# Patient Record
Sex: Male | Born: 1935
Health system: Southern US, Community
[De-identification: ages and names within clinical notes are randomized; demographics above are authoritative.]

## PROBLEM LIST (undated history)

## (undated) DIAGNOSIS — F0390 Unspecified dementia without behavioral disturbance: Secondary | ICD-10-CM

## (undated) DIAGNOSIS — I10 Essential (primary) hypertension: Secondary | ICD-10-CM

## (undated) DIAGNOSIS — W19XXXA Unspecified fall, initial encounter: Secondary | ICD-10-CM

## (undated) DIAGNOSIS — I252 Old myocardial infarction: Secondary | ICD-10-CM

## (undated) DIAGNOSIS — E785 Hyperlipidemia, unspecified: Secondary | ICD-10-CM

## (undated) HISTORY — PX: OTHER SURGICAL HISTORY: SHX169

## (undated) HISTORY — PX: ROTATOR CUFF REPAIR: SHX139

## (undated) HISTORY — PX: CORONARY ANGIOPLASTY WITH STENT PLACEMENT: SHX49

---
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
## 2004-10-11 ENCOUNTER — Emergency Department: Payer: Self-pay | Admitting: Emergency Medicine

## 2004-10-18 ENCOUNTER — Ambulatory Visit: Payer: Self-pay | Admitting: Physician Assistant

## 2004-10-26 ENCOUNTER — Other Ambulatory Visit: Payer: Self-pay

## 2004-11-02 ENCOUNTER — Inpatient Hospital Stay: Payer: Self-pay | Admitting: Unknown Physician Specialty

## 2006-04-09 ENCOUNTER — Inpatient Hospital Stay: Payer: Self-pay | Admitting: Internal Medicine

## 2006-04-09 ENCOUNTER — Other Ambulatory Visit: Payer: Self-pay

## 2006-04-25 ENCOUNTER — Ambulatory Visit: Payer: Self-pay | Admitting: Unknown Physician Specialty

## 2007-02-15 ENCOUNTER — Inpatient Hospital Stay: Payer: Self-pay | Admitting: Internal Medicine

## 2007-02-15 ENCOUNTER — Other Ambulatory Visit: Payer: Self-pay

## 2009-02-08 ENCOUNTER — Ambulatory Visit: Payer: Self-pay | Admitting: Internal Medicine

## 2009-02-13 ENCOUNTER — Ambulatory Visit: Payer: Self-pay | Admitting: Internal Medicine

## 2009-11-22 ENCOUNTER — Emergency Department: Payer: Self-pay | Admitting: Emergency Medicine

## 2012-04-25 DIAGNOSIS — I10 Essential (primary) hypertension: Secondary | ICD-10-CM

## 2012-05-25 ENCOUNTER — Ambulatory Visit: Payer: Self-pay | Admitting: Ophthalmology

## 2012-06-01 ENCOUNTER — Ambulatory Visit: Payer: Self-pay | Admitting: Ophthalmology

## 2012-07-21 ENCOUNTER — Ambulatory Visit: Payer: Self-pay | Admitting: Ophthalmology

## 2013-01-07 ENCOUNTER — Observation Stay: Payer: Self-pay | Admitting: Family Medicine

## 2013-01-07 LAB — PROTIME-INR: INR: 1.1

## 2013-01-07 LAB — HEMOGLOBIN: HGB: 14.2 g/dL (ref 13.0–18.0)

## 2013-01-08 LAB — COMPREHENSIVE METABOLIC PANEL
Bilirubin,Total: 0.8 mg/dL (ref 0.2–1.0)
Calcium, Total: 8.2 mg/dL — ABNORMAL LOW (ref 8.5–10.1)
EGFR (Non-African Amer.): >60
Glucose: 115 mg/dL — ABNORMAL HIGH (ref 65–99)
Osmolality: 281 (ref 275–301)
Potassium: 3.4 mmol/L — ABNORMAL LOW (ref 3.5–5.1)
SGOT(AST): 18 U/L (ref 15–37)
Sodium: 141 mmol/L (ref 136–145)
Total Protein: 6.5 g/dL (ref 6.4–8.2)

## 2013-01-08 LAB — CBC WITH DIFFERENTIAL/PLATELET
Basophil #: 0 10*3/uL (ref 0.0–0.1)
Basophil %: 0.8 %
Eosinophil #: 0.3 10*3/uL (ref 0.0–0.7)
Eosinophil %: 4.2 %
HCT: 39.6 % — ABNORMAL LOW (ref 40.0–52.0)
Lymphocyte #: 1.3 10*3/uL (ref 1.0–3.6)
Lymphocyte %: 19.5 %
MCH: 31.5 pg (ref 26.0–34.0)
MCHC: 33.7 g/dL (ref 32.0–36.0)
Monocyte #: 1 x10 3/mm (ref 0.2–1.0)
Monocyte %: 15 %
Neutrophil #: 3.9 10*3/uL (ref 1.4–6.5)
Neutrophil %: 60.5 %
Platelet: 143 10*3/uL — ABNORMAL LOW (ref 150–440)
RDW: 14 % (ref 11.5–14.5)

## 2013-01-09 LAB — CBC WITH DIFFERENTIAL/PLATELET
Basophil #: 0.1 10*3/uL (ref 0.0–0.1)
Eosinophil %: 5.6 %
HCT: 40 % (ref 40.0–52.0)
HGB: 13.8 g/dL (ref 13.0–18.0)
Lymphocyte %: 23.3 %
MCH: 32.6 pg (ref 26.0–34.0)
MCHC: 34.6 g/dL (ref 32.0–36.0)
MCV: 94 fL (ref 80–100)
Monocyte %: 16.5 %
Neutrophil #: 3.1 10*3/uL (ref 1.4–6.5)
Platelet: 133 10*3/uL — ABNORMAL LOW (ref 150–440)
RBC: 4.24 10*6/uL — ABNORMAL LOW (ref 4.40–5.90)
RDW: 14.5 % (ref 11.5–14.5)
WBC: 5.8 10*3/uL (ref 3.8–10.6)

## 2013-01-09 LAB — BASIC METABOLIC PANEL
Anion Gap: 3 — ABNORMAL LOW (ref 7–16)
Chloride: 110 mmol/L — ABNORMAL HIGH (ref 98–107)
Co2: 29 mmol/L (ref 21–32)
EGFR (African American): >60
EGFR (Non-African Amer.): >60
Glucose: 93 mg/dL (ref 65–99)
Osmolality: 281 (ref 275–301)
Sodium: 142 mmol/L (ref 136–145)

## 2015-02-13 DIAGNOSIS — Z0000 Encounter for general adult medical examination without abnormal findings: Secondary | ICD-10-CM | POA: Diagnosis not present

## 2015-02-14 DIAGNOSIS — I2510 Atherosclerotic heart disease of native coronary artery without angina pectoris: Secondary | ICD-10-CM | POA: Diagnosis not present

## 2015-02-14 DIAGNOSIS — I10 Essential (primary) hypertension: Secondary | ICD-10-CM | POA: Diagnosis not present

## 2015-02-14 DIAGNOSIS — I5022 Chronic systolic (congestive) heart failure: Secondary | ICD-10-CM | POA: Diagnosis not present

## 2015-02-14 DIAGNOSIS — E782 Mixed hyperlipidemia: Secondary | ICD-10-CM | POA: Diagnosis not present

## 2015-02-15 DIAGNOSIS — R7309 Other abnormal glucose: Secondary | ICD-10-CM | POA: Diagnosis not present

## 2015-02-15 DIAGNOSIS — I10 Essential (primary) hypertension: Secondary | ICD-10-CM | POA: Diagnosis not present

## 2015-02-15 DIAGNOSIS — Z0000 Encounter for general adult medical examination without abnormal findings: Secondary | ICD-10-CM | POA: Diagnosis not present

## 2015-02-15 DIAGNOSIS — E782 Mixed hyperlipidemia: Secondary | ICD-10-CM | POA: Diagnosis not present

## 2015-02-21 NOTE — Op Note (Signed)
PATIENT NAME:  [name], [name] MR#:  [id number] DATE OF BIRTH:  1926-11-28  DATE OF PROCEDURE:  07/21/2012  PREOPERATIVE DIAGNOSIS: Visually significant cataract of the right eye.   POSTOPERATIVE DIAGNOSIS: Visually significant cataract of the right eye.   OPERATIVE PROCEDURE: Cataract extraction by phacoemulsification with implant of intraocular lens to the right eye.   SURGEON: Birder Robson, MD.   ANESTHESIA:  1. Managed anesthesia care.  2. Topical tetracaine drops followed by 2% Xylocaine jelly applied in the preoperative holding area.   COMPLICATIONS: None.   TECHNIQUE:   Stop and chop.  DESCRIPTION OF PROCEDURE: The patient was examined and consented in the preoperative holding area where the aforementioned topical anesthesia was applied to the right eye and then brought back to the Operating Room where the right eye was prepped and draped in the usual sterile ophthalmic fashion and a lid speculum was placed. A paracentesis was created with the side port blade and the anterior chamber was filled with viscoelastic. A near clear corneal incision was performed with the steel keratome. A continuous curvilinear capsulorrhexis was performed with a cystotome followed by the capsulorrhexis forceps. Hydrodissection and hydrodelineation were carried out with BSS on a blunt cannula. The lens was removed in a stop and chop technique and the remaining cortical material was removed with the irrigation-aspiration handpiece. The capsular bag was inflated with viscoelastic and the Tecnis ZCB00 23-diopter lens, serial number [id number] was placed in the capsular bag without complication. The remaining viscoelastic was removed from the eye with the irrigation-aspiration handpiece. The wounds were hydrated. The anterior chamber was flushed with Miostat and the eye was inflated to physiologic pressure. The wounds were found to be water tight. The eye was dressed with Vigamox. The patient was given  protective glasses to wear throughout the day and a shield with which to sleep tonight. The patient was also given drops with which to begin a drop regimen today and will follow up with me in one day.     ____________________________ Livingston Diones. Graclynn Vanantwerp, MD wlp:bjt D: 07/21/2012 12:35:02 ET T: 07/21/2012 13:47:20 ET JOB#: [id number]  cc: Agustina Witzke L. Rani Idler, MD, <Dictator> Livingston Diones Jesiel Garate MD ELECTRONICALLY SIGNED 08/24/2012 13:45

## 2015-02-21 NOTE — Op Note (Signed)
PATIENT NAME:  [name], [name] MR#:  [id number] DATE OF BIRTH:  1927/10/13  DATE OF PROCEDURE:  06/01/2012  PREOPERATIVE DIAGNOSIS: Visually significant cataract of the left eye.   POSTOPERATIVE DIAGNOSIS: Visually significant cataract of the left eye.   OPERATIVE PROCEDURE: Cataract extraction by phacoemulsification with implant of intraocular lens to left eye.   SURGEON: Birder Robson, MD.   ANESTHESIA:  1. Managed anesthesia care.  2. Topical tetracaine drops followed by 2% Xylocaine jelly applied in the preoperative holding area.   COMPLICATIONS: None.   TECHNIQUE:  Stop and chop.   DESCRIPTION OF PROCEDURE: The patient was examined and consented in the preoperative holding area where the aforementioned topical anesthesia was applied to the left eye and then brought back to the Operating Room where the left eye was prepped and draped in the usual sterile ophthalmic fashion and a lid speculum was placed. A paracentesis was created with the side port blade and the anterior chamber was filled with viscoelastic. A near clear corneal incision was performed with the steel keratome. A continuous curvilinear capsulorrhexis was performed with a cystotome followed by the capsulorrhexis forceps. Hydrodissection and hydrodelineation were carried out with BSS on a blunt cannula. The lens was removed in a stop and chop technique and the remaining cortical material was removed with the irrigation-aspiration handpiece. The capsular bag was inflated with viscoelastic and the Tecnis ZCB00 24.0-diopter lens, serial number [id number] was placed in the capsular bag without complication. The remaining viscoelastic was removed from the eye with the irrigation-aspiration handpiece. The wounds were hydrated. The anterior chamber was flushed with Miostat and the eye was inflated to physiologic pressure. The wounds were found to be water tight. The eye was dressed with Vigamox. The patient was given protective  glasses to wear throughout the day and a shield with which to sleep tonight. The patient was also given drops with which to begin a drop regimen today and will follow-up with me in one day.  ____________________________ Livingston Diones. Sharin Altidor, MD wlp:slb D: 06/01/2012 20:10:00 ET T: 06/02/2012 08:36:53 ET JOB#: [id number]  cc: Ogechi Kuehnel L. Anagha Loseke, MD, <Dictator> Livingston Diones Aarian Cleaver MD ELECTRONICALLY SIGNED 06/09/2012 12:49

## 2015-02-24 NOTE — Op Note (Signed)
PATIENT NAME:  [name], [name] MR#:  [id number] DATE OF BIRTH:  10/31/1927  DATE OF PROCEDURE:  01/09/2013  PREOPERATIVE DIAGNOSIS:  Hemorrhoids.   POSTOPERATIVE DIAGNOSIS:  Hemorrhoids.   PROCEDURE:  Hemorrhoidectomy.   SURGEON:  Rochel Brome, M.D.   ANESTHESIA:  General.   INDICATIONS:  This 81 year old male came in with bright red rectal bleeding and colonoscopic findings of large internal hemorrhoids, physical findings of external hemorrhoids. Hemorrhoidectomy was recommended for definitive treatment to resolve his bleeding.  The patient was placed on the operating table in the supine position under general anesthesia. The legs were elevated into the lithotomy position using ankle straps. The anal area was prepared with ChloraPrep and draped in a sterile manner. The anoderm was infiltrated with 0.5% Sensorcaine with epinephrine. The anal canal was dilated large enough to admit 4 fingers. The bivalve anal retractor was introduced and further gently dilated the anal canal. Multiple large internal and external hemorrhoids were demonstrated. The largest internal hemorrhoids were found at the 4 o'clock position and next largest at 11 o'clock and next largest at 8 o'clock position.  Three internal and external hemorrhoidectomy columns were removed. Beginning at 11 o'clock position, a high ligation of the internal component was done with a 2-0 chromic suture ligature. A V-shaped incision was made externally with a scalpel. Next, electrocautery was used to dissect the external hemorrhoid away from the surrounding subcutaneous tissues. Dissection was carried out by exposing the internal anal sphincter and dissection was carried up to the previously placed suture ligature where the hemorrhoid was further ligated with the same ligature excised. The wound was inspected. Several small bleeding points were cauterized. The wound was closed with a running locked tied 2-0 chromic with a small opening left  externally for drainage.   Next, a similar procedure was carried out at the 4 o'clock position with a similar dissection ligation and closure.   Next, the same procedure is carried out at the 8 o'clock position.  Following this, the wound was further prepared with Betadine and skin and subcutaneous tissues were infiltrated with 20 mL of Exparel.  Next, dressings were applied with paper tape. The patient tolerated surgery satisfactorily, did have some bigeminy with induction of anesthesia which soon resolved and was transferred to the recovery room in satisfactory condition.    ____________________________ Lenna Sciara. Rochel Brome, MD jws:ce D: 01/09/2013 13:53:42 ET T: 01/09/2013 18:21:04 ET JOB#: [id number]  cc: Loreli Dollar, MD, <Dictator> Loreli Dollar MD ELECTRONICALLY SIGNED 01/12/2013 19:05

## 2015-02-24 NOTE — Consult Note (Signed)
Pt with rectal bleeding of BRB, last colonoscopy over 6 years ago.  No chest pain, no SOB, plan colonoscopy tomorrow after Golytely prep tonight.  Electronic Signatures: Manya Silvas (MD)  (Signed on 06-Mar-14 18:02)  Authored  Last Updated: 06-Mar-14 18:02 by Manya Silvas (MD)

## 2015-02-24 NOTE — H&P (Signed)
PATIENT NAME:  [name], [name] MR#:  [id number] DATE OF BIRTH:  1927/07/13  DATE OF ADMISSION:  01/07/2013  PRIMARY CARE PHYSICIAN:  Dr. Serita Kyle, [facility].   CHIEF COMPLAINT:  Rectal bleeding.   HISTORY OF PRESENT ILLNESS:  This is an 81 year old gentleman who complains of 3 days of bright red blood per rectum, with a significant amount filling the toilet. He reports no associated dizziness, vision changes, syncopal episodes, chest pain, dyspnea, or worsening weakness. Denies any abdominal pain, nausea, vomiting, or diarrhea associated. He has a known history of adenomas, with last colonoscopy in 04/25/2006 per Dr. Vira Agar, consistent with diverticulosis. He also has a known history of hemorrhoids, and also a known history of coronary artery disease status post stent placement, and congestive heart failure. He was seen in the office today for the 3 days of rectal bleeding on exam. He has significant external hemorrhoids, with no active bleeding, but digital consistent with blood per rectum. I spoke with Dawson Bills, who did recommend inpatient treatment and evaluation. Therefore, the patient was offered opportunity to be admitted [facility], and patient agreed.   PAST MEDICAL HISTORY: 1.  History of coronary artery disease status post stent placement in December 1990, followed by Dr. Nehemiah Massed.  2.  History of diverticulosis and history of colonic adenomas.  3.  History of peptic ulcer disease.  4.  History of GI bleed, 1988.  5.  History of systolic congestive heart failure with an EF of 40%, done with echo on May 2013, followed by Dr. Nehemiah Massed.  6.  History of hyperlipidemia with LDL of 83 in September 2013.  7.  History of osteoarthritis.   SURGICAL HISTORY: 1.  Status post stent placement, 1990 per [facility].  2.  Rotator cuff repair in 2005.  3.  History of bilateral cataract surgery.   REVIEW OF SYSTEMS: As stated above.   FAMILY HISTORY:  Father deceased at age 9 from a  complete heart block. Mother deceased at age 79 from heart disease. Has 8 sisters, 29 of whom are deceased, none of whom have heart disease or cancer.   SOCIAL HISTORY:  He is married and lives with his wife. He is retired. No tobacco, alcohol or drug use. No supplements. Exercise is nonexistent.   PHYSICAL EXAM:  Height 5 feet 7, weight 179, blood pressure 146/92, pulse 66.  GENERAL  EXAM:  This is a well-appearing white male in no apparent distress.  HEENT:  Extraocular movements intact. Pupils are equal and reactive to light and accommodation. Moist mucous membranes.  NECK:  Supple, without lymphadenopathy.  CARDIOVASCULAR:  Regular rate and rhythm. No murmurs, rubs, or gallops. Normal S1, S2.  RESPIRATORY:  Clear to auscultation bilaterally.  ABDOMEN:  Soft, nontender, nondistended. No rebound or guarding.  EXTREMITIES:  No edema.  SKIN:  Normal color, normal turgor.  RECTAL EXAM:  Moderate external hemorrhoids, with blood on digital exam.  NEUROLOGIC:  Alert and oriented x 3. Cranial nerves II-XII grossly intact with no focal deficits.   PERTINENT LABS:  Hemoglobin of 13.9, platelets of 161 and white blood cell count is 7.   ASSESSMENT AND PLAN: 1.  Rectal bleeding:  Acute. The patient has a 3-day history of rectal bleeding. Unsure of etiology at this point. He does have a history of colonic adenomas, and also has a history of diverticulosis and a history of hemorrhoids. My concern is that this could be more internal, therefore after speaking with Dawson Bills with GI, recommended hospitalization  with further evaluation, possible colonoscopy per GI. GI has been consulted; at this time holding his aspirin therapy. Will place him on n.p.o. at this time and cover him with D5 half-normal saline while he is n.p.o. except for medications. He is hemodynamically stable and does not need a transfusion at this time.  2.  History of coronary artery disease, and history of congestive heart failure:   Appears to be stable. No changes to his home regimen except for holding the aspirin.  3.  Other chronic medical issues remain stable at this time.   DISPOSITION:  HE IS A DNR/DNI.   Will be admitted to telemetry on observation status, awaiting further GI evaluation and recommendations, I anticipate a short hospital stay pending a possible procedure.    ____________________________ Dion Body, MD kl:dm D: 01/07/2013 12:29:23 ET T: 01/07/2013 13:16:22 ET JOB#: [id number]  cc: Dion Body, MD, <Dictator> Dion Body MD ELECTRONICALLY SIGNED 01/29/2013 7:59

## 2015-02-24 NOTE — Consult Note (Signed)
Pt had colonoscopy showing diverticulosis and prominent internal/external hemorrhoids.  this was the bleeding site.  Dr, Rochel Brome will see patient today. Pt desires removal.  Electronic Signatures: Manya Silvas (MD)  (Signed on 07-Mar-14 15:13)  Authored  Last Updated: 07-Mar-14 15:13 by Manya Silvas (MD)

## 2015-02-24 NOTE — Consult Note (Signed)
Chief Complaint:  Subjective/Chief Complaint Covering for Dr. Vira Agar. Some rectal bleeding last night. Large hemorrhoids.   VITAL SIGNS/ANCILLARY NOTES: **Vital Signs.:   08-Mar-14 04:59  Vital Signs Type Routine  Temperature Temperature (F) 97.6  Celsius 36.4  Temperature Source oral  Pulse Pulse 64  Respirations Respirations 18  Systolic BP Systolic BP 519  Diastolic BP (mmHg) Diastolic BP (mmHg) 61  Mean BP 79  Pulse Ox % Pulse Ox % 91  Pulse Ox Activity Level  At rest  Oxygen Delivery Room Air/ 21 %   Brief Assessment:  Cardiac Regular   Respiratory clear BS   Gastrointestinal Normal   Lab Results: Routine Chem:  08-Mar-14 05:44   Glucose, Serum 93  BUN 7  Creatinine (comp) 0.97  Sodium, Serum 142  Potassium, Serum 4.3  Chloride, Serum  110  CO2, Serum 29  Calcium (Total), Serum  8.3  Anion Gap  3  Osmolality (calc) 281  eGFR (African American) >60  eGFR (Non-African American) >60 (eGFR values <62m/min/1.73 m2 may be an indication of chronic kidney disease (CKD). Calculated eGFR is useful in patients with stable renal function. The eGFR calculation will not be reliable in acutely ill patients when serum creatinine is changing rapidly. It is not useful in  patients on dialysis. The eGFR calculation may not be applicable to patients at the low and high extremes of body sizes, pregnant women, and vegetarians.)  Routine Hem:  08-Mar-14 05:44   WBC (CBC) 5.8  RBC (CBC)  4.24  Hemoglobin (CBC) 13.8  Hematocrit (CBC) 40.0  Platelet Count (CBC)  133  MCV 94  MCH 32.6  MCHC 34.6  RDW 14.5  Neutrophil % 53.5  Lymphocyte % 23.3  Monocyte % 16.5  Eosinophil % 5.6  Basophil % 1.1  Neutrophil # 3.1  Lymphocyte # 1.4  Monocyte # 1.0  Eosinophil # 0.3  Basophil # 0.1 (Result(s) reported on 09 Jan 2013 at 06:16AM.)   Assessment/Plan:  Assessment/Plan:  Assessment Hemorrhoidal bleeding.   Plan Fro hemorrhoidectomy at 11AM today. Will check back in AM.  Thanks.   Electronic Signatures: OVerdie Shire(MD)  (Signed 08-Mar-14 09:49)  Authored: Chief Complaint, VITAL SIGNS/ANCILLARY NOTES, Brief Assessment, Lab Results, Assessment/Plan   Last Updated: 08-Mar-14 09:49 by OVerdie Shire(MD)

## 2015-02-24 NOTE — Discharge Summary (Signed)
PATIENT NAME:  [name] [name] MR#:  [id number] DATE OF BIRTH:  18-Jan-1927  DATE OF ADMISSION:  01/07/2013  DATE OF DISCHARGE: 01/11/2013    DISCHARGE DIAGNOSES: 1.  Rectal bleeding secondary to bleeding hemorrhoid.  2.  History of coronary artery disease.  3.  History of systolic congestive heart failure with ejection fraction of 40% on echo, seen on 03/2012.  4.  History of osteoarthritis.  5.  History of hyperlipidemia.   DISCHARGE MEDICATIONS: 1.  Prilosec 20 mg p.o. daily.  2.  Amoxicillin 500 mg p.o. t.i.d.  3.  Spironolactone 25 mg p.o. daily.  4.  Lisinopril 10 mg p.o. daily.  5.  Carvedilol 3.125 mg p.o. b.i.d.  6.  Simvastatin 40 mg p.o. at bedtime.  7.  Percocet 5/325, 1 tab every 4 hours as needed for pain.  8.  Docusate 100 mg p.o. b.i.d.   CONSULTS:  GI per Dr. Vira Agar, and also surgery per Dr. Tamala Julian.   PROCEDURES: Had a colonoscopy performed that did convey diverticulosis and large internal and external hemorrhoids. The patient underwent a hemorrhoidectomy on 01/09/2013 per Dr. Tamala Julian.   PERTINENT LABORATORY DATA:  On March 8th, he had a hemoglobin of 13.8.   BRIEF HOSPITAL COURSE:  1.  Acute rectal bleeding. The patient initially was admitted because of acute rectal bleeding. Colonoscopy was performed, which did show hemorrhoid bleeding. Surgery was consulted per Dr. Tamala Julian. Hemorrhoidectomy was performed. The patient tolerated the procedure. It has some significant pain. He is being treated with Percocet at this time. He will be discharged home. Advised to advance diet and ambulation as tolerated. He will follow up with Dr. Netty Starring within 10 days for further evaluation.  2.  Other chronic medical issues remain stable. Continue home regimen with the exception of aspirin. We will plan to hold the aspirin for now. Will likely restart that upon re-evaluation.   DISPOSITION:  He is in stable condition, being discharged to home. Follow up with Dr. Netty Starring within 10  days.      ____________________________ Dion Body, MD kl:dmm D: 01/11/2013 08:01:05 ET T: 01/11/2013 10:41:02 ET JOB#: [id number]  cc: Dion Body, MD, <Dictator> [facility] Dion Body MD ELECTRONICALLY SIGNED 01/29/2013 7:59

## 2015-02-24 NOTE — Consult Note (Signed)
PATIENT NAME:  [name], [name] MR#:  [id number] DATE OF BIRTH:  1927-07-11  DATE OF CONSULTATION:  01/08/2013  REFERRING PHYSICIAN:   CONSULTING PHYSICIAN:  Loreli Dollar, MD   HISTORY OF PRESENT ILLNESS: This 81 year old male has a chief complaint of rectal bleeding. He reports greater than 50 year history of hemorrhoids and occasionally has seen some blood and occasionally has some swelling and bulging and moderate discomfort. However, in the last few days, he has been passing bright red blood with his bowel movements, turning commode water red and was admitted emergently. He reports no associated severe pain. No abdominal pain. No nausea or vomiting. No chills or fever. No dizziness or syncope. He was admitted and begun on IV fluids and had gastroenterology consultation with Dr. Vira Agar and  today had colonoscopy. Did have findings of diverticulosis, but had findings of large internal and also external hemorrhoids and it was demonstrated that the cause of bleeding was the large internal hemorrhoids.   PAST MEDICAL HISTORY: Does include a history of coronary artery disease, did have a myocardial infarction in 1990 and was treated with stent, also has some past history of systolic congestive failure, had echocardiogram May 2013 with ejection fraction 40%. He reports he has been doing very well with his heart in recent months. He is quite active physically. He has had no chest pain.   Other history does include hyperlipidemia, past history of peptic ulcer disease and gastrointestinal bleeding. History of osteoarthritis.   PAST SURGICAL HISTORY: Includes he above-mentioned this insertion of stent 1990.  History of rotator cuff surgery 2005, history of cataract surgery.   MEDICATIONS: Include amoxicillin 500 mg 3 times a day, aspirin 81 mg daily, carvedilol 3.125 mg 2 times per day, lisinopril 10 mg daily, Prilosec 20 mg daily, Simvastatin 40 mg daily, spironolactone 25 mg daily.   ALLERGIES:  VICODIN.     FAMILY HISTORY: Positive for heart disease.   SOCIAL HISTORY: He been married for 43 years,  accompanied by his wife and his son, does not smoke, does not drink any alcohol.   REVIEW OF SYSTEMS: He reports no other recent acute illness such as cough, cold or sore throat. No difficulties breathing with exertion. No chest pains. He has maintained a steady weight. Has good appetite. No nausea or vomiting. Occasionally has some mild constipation and occasionally takes a stool softener. He has no urinary symptoms. No recent sores or boils. No ankle edema. Review of systems otherwise negative.   PHYSICAL EXAMINATION: GENERAL: He is awake, alert and oriented. He is able to walk easily from the bathroom to the bed.  VITAL SIGNS: Temperature 98.3, pulse 69, respirations 18, blood pressure 132/67, pulse oximetry 95%.  SKIN: Warm and dry without rash.   HEENT: Pupils equal, reactive to light, extraocular movements are intact. Palpebral conjunctivae normal red color. Pharynx is clear.  NECK: No palpable mass.  LUNGS: Lungs sounds are clear. No respiratory distress.  HEART: Regular rhythm, S1 and S2 without murmur.  ABDOMEN: Soft, flat, nontender with no palpable mass. No hepatomegaly.  RECTAL: Exam demonstrates multiple external hemorrhoids, which appear to be minimally swollen. Digital exam demonstrates marked amount of soft, mobile tissue consistent with internal hemorrhoids. No hard palpable mass.  EXTREMITIES: No dependent edema.  NEUROLOGIC: Awake, alert, oriented, moving all extremities.   CLINICAL DATA: His glucose slightly high at 115, potassium slightly low at 3.4, albumin 3.3, white blood count 6800. Hemoglobin 13.3. Platelet count slightly low at 143,000. Pro time 14.1  seconds.   IMPRESSION: Internal and external hemorrhoids with bleeding. He is currently in stable condition. I did recommend internal and external hemorrhoidectomy and with the presentation of significant amounts  of bright red blood, recommended that we proceed with the surgery tomorrow morning while he is still in the hospital and I have discussed the operation, care, risks, and benefits with him in detail and will get it scheduled and probably keep him overnight after surgery for a period of observation.      ____________________________ Lenna Sciara. Rochel Brome, MD jws:cc D: 01/08/2013 17:45:13 ET T: 01/08/2013 19:51:56 ET JOB#: [id number]  cc: Loreli Dollar, MD, <Dictator> Loreli Dollar MD ELECTRONICALLY SIGNED 01/09/2013 14:48

## 2015-02-24 NOTE — Consult Note (Signed)
Brief Consult Note: Diagnosis: Rectal bleeding.   Patient was seen by consultant.   Consult note dictated.   Comments: Appreciate consult for 81 y/o caucasian man with history diverticulosis, adenomatous polyps, PUD, CHF, LVEF 40%, and cardiac stenting (4098), for evaluation of rectal bleeding. Patient reports onset of bright red blood with normal-type bowel movements since Tuesday evening at a frequency of once daily. States the blood turns the water red and he cannot see through it. Says sometimes he strains with bowel movements, but not often. Denies abdominal pain, constipation, NVD, black/tarry stools, further GI complaints. States his last episode of bleeding was about 0700 this am.  Had one episode of dizziness after bending over, but none further. Denies syncope and further complaints. Was in his usual state of health prior to this. Last colonsocopy 2007. Has had EGD in past- last was 2007 with gastritis. Additionally- hemoglobin today approx 10am 13.9, wbc 7, Platelets 161. Hemoglobin 9/13 was 13.5- found in chart review. Metc 1/21 with normal kidney function and electrolytes  Impression and plan: Rectal bleeding- may be anal outlet v. other. Discussed with Dr Tiffany Kocher, will plan for colonsocopy tommorrow if stable cardiopulmonary wise.  Electronic Signatures: Stephens November H (NP)  (Signed 06-Mar-14 16:30)  Authored: Brief Consult Note   Last Updated: 06-Mar-14 16:30 by Theodore Demark (NP)

## 2015-02-24 NOTE — Consult Note (Signed)
PATIENT NAME:  [name], [name] MR#:  [id number] DATE OF BIRTH:  [birth year]  DATE OF CONSULTATION:  01/07/2013  REFERRING PHYSICIAN:   CONSULTING PHYSICIAN:  Theodore Demark, NP  GI has been consulted by Dr. Netty Starring for the evaluation of rectal bleeding. We appreciate this consult for a very pleasant 81 year old Caucasian man with history of diverticulitis, adenomatous polyps, peptic ulcer disease, CHF, LVEF 40% and cardiac stenting in 1990 for evaluation of rectal bleeding. The patient reports onset of bright red blood with normal-type bowel movements since Tuesday evening at a frequency of once daily. States the blood turns the water red and he cannot see through it. He says sometimes he strains with bowel movements, but not often. Denies abdominal pain, constipation, nausea, vomiting, diarrhea, black tarry stools, and no further GI complaints. He states his last episode of bleeding was about 0700 this morning. Had one episode of dizziness after bending over, but none further. Denies syncope and further complaints. Was in his usual state of health prior to this. Last colonoscopy 2007. Has had EGD in the past, the last was 2007 with gastritis. Additionally, hemoglobin today at approximately 10:00 a.m. was 13.9. WC 7, platelets 161, hemoglobin done September 2013 was 13.5, chemistry panel 11/24/2012 demonstrated normal kidney function and normal electrolytes. This was found in [facility] chart review.  PAST MEDICAL HISTORY: CAD status post stent placement 1990, diverticulosis PUD. GI bleed 1988, colonic adenoma, systolic CHF with an EF of 40%, hyperlipidemia, osteoarthritis, PTCA 1990, rotator cuff repair 2005, bilateral cataract surgeries.  MEDICATIONS:  Aspirin 81 mg p.o. daily, did not take this today. Lisinopril 10 mg p.o. daily, omeprazole 20 mg p.o. daily, simvastatin 40 mg p.o. q.p.m., carvedilol 3.125 one tab b.i.d., spironolactone 25 mg p.o. daily, amoxicillin 500 mg 1 tab p.o. t.i.d. for recent  sinus infection.   ALLERGIES:  LINCOCIN.   FAMILY HISTORY: Significant for heart block, heart disease. No family history of colorectal cancer, colon polyps, liver disease or ulcers.   SOCIAL HISTORY: Married, lives with wife. No alcohol, tobacco, or illicits. Active with exercise.  REVIEW OF SYSTEMS:  10 systems reviewed. Unremarkable other than what is noted above.   MOST VITAL SIGNS:  Temperature 98.1, pulse 61, respiratory rate 18, blood pressure 143/78, SaO2 95%.   PHYSICAL EXAMINATION: GENERAL: Pleasant well-appearing Caucasian man lying in bed in no acute distress.  HEENT: Normocephalic and atraumatic. Conjunctivae are pink. Sclerae are clear. No redness, drainage or inflammation to the eyes, nares or mouth.  NECK:  Supple. No JVD, thyromegaly, lymphadenopathy.  RESPIRATORY:  Respirations eupneic. Lungs clear.  CARDIAC: S1, S2 RRR. No MRG. Peripheral pulses 2+. No appreciable edema.  ABDOMEN: Nondistended. Bowel sounds x 4. Soft, nontender. No hepatosplenomegaly, masses, rebound, tenderness, guarding, peritoneal signs, rigidity or other abnormalities. He is nontender.  RECTAL: Several hemorrhoids at the anal ring. Stool bright red in small amount, nontender. No active bleeding at this time.  SKIN:  Warm, dry, pink. No erythema, lesion or rash.  EXTREMITIES:  MAEW x 4. Strength 5/5. Sensation intact. No clubbing or cyanosis.  NEUROLOGIC: Alert, oriented x 3. Cranial nerves II through XII grossly intact. Speech clear. No facial droop.  PSYCHIATRIC:  Calm, cooperative, pleasant, logical train of thought.   IMPRESSION AND PLAN:  Rectal bleeding may be anal outlet versus other. We will order a PT-INR, schedule a hemoglobin for later this afternoon. Discussed with Dr. Vira Agar. We will plan for colonoscopy tomorrow if he is stable colorrhaphy or pulmonary-wise and we will assess  an EKG later on this evening.  These services were provided by Stephens November, MSN, [facility] in collaboration with  Dr. Gaylyn Cheers with whom I have discussed this patient in full.  Thank you for this consult.    ____________________________ Theodore Demark, NP chl:ce D: 01/07/2013 16:37:41 ET T: 01/07/2013 16:51:16 ET JOB#: [id number]  cc: Theodore Demark, NP, <Dictator> [place] SIGNED 02/16/2013 8:25

## 2015-02-24 NOTE — Consult Note (Signed)
Chief Complaint:  Subjective/Chief Complaint S/P hemorrhoidectomy yest. Sore anus. Otherwise, no complaints. No further bleeding.   VITAL SIGNS/ANCILLARY NOTES: **Vital Signs.:   09-Mar-14 07:45  Vital Signs Type Routine  Temperature Temperature (F) 97.6  Celsius 36.4  Temperature Source oral  Pulse Pulse 72  Respirations Respirations 18  Systolic BP Systolic BP 892  Diastolic BP (mmHg) Diastolic BP (mmHg) 68  Mean BP 86  Pulse Ox % Pulse Ox % 93  Pulse Ox Activity Level  At rest  Oxygen Delivery 2L   Brief Assessment:  Cardiac Regular   Respiratory clear BS   Gastrointestinal Normal   Lab Results: Routine Chem:  08-Mar-14 05:44   Glucose, Serum 93  BUN 7  Creatinine (comp) 0.97  Sodium, Serum 142  Potassium, Serum 4.3  Chloride, Serum  110  CO2, Serum 29  Calcium (Total), Serum  8.3  Anion Gap  3  Osmolality (calc) 281  eGFR (African American) >60  eGFR (Non-African American) >60 (eGFR values <63mL/min/1.73 m2 may be an indication of chronic kidney disease (CKD). Calculated eGFR is useful in patients with stable renal function. The eGFR calculation will not be reliable in acutely ill patients when serum creatinine is changing rapidly. It is not useful in  patients on dialysis. The eGFR calculation may not be applicable to patients at the low and high extremes of body sizes, pregnant women, and vegetarians.)  Routine Hem:  08-Mar-14 05:44   WBC (CBC) 5.8  RBC (CBC)  4.24  Hemoglobin (CBC) 13.8  Hematocrit (CBC) 40.0  Platelet Count (CBC)  133  MCV 94  MCH 32.6  MCHC 34.6  RDW 14.5  Neutrophil % 53.5  Lymphocyte % 23.3  Monocyte % 16.5  Eosinophil % 5.6  Basophil % 1.1  Neutrophil # 3.1  Lymphocyte # 1.4  Monocyte # 1.0  Eosinophil # 0.3  Basophil # 0.1 (Result(s) reported on 09 Jan 2013 at 06:16AM.)   Assessment/Plan:  Assessment/Plan:  Assessment LGI bleeding from hemorrhoids. S/P hemorrhoidectomy. No further bleeding.   Plan Will sign  off. Follow Dr. Thompson Caul recommendations. Thanks.   Electronic Signatures: Verdie Shire (MD)  (Signed 09-Mar-14 08:46)  Authored: Chief Complaint, VITAL SIGNS/ANCILLARY NOTES, Brief Assessment, Lab Results, Assessment/Plan   Last Updated: 09-Mar-14 08:46 by Verdie Shire (MD)

## 2015-03-15 DIAGNOSIS — Z23 Encounter for immunization: Secondary | ICD-10-CM | POA: Diagnosis not present

## 2015-03-31 DIAGNOSIS — M5432 Sciatica, left side: Secondary | ICD-10-CM | POA: Diagnosis not present

## 2015-07-28 DIAGNOSIS — J069 Acute upper respiratory infection, unspecified: Secondary | ICD-10-CM | POA: Diagnosis not present

## 2015-07-28 DIAGNOSIS — E780 Pure hypercholesterolemia: Secondary | ICD-10-CM | POA: Diagnosis not present

## 2015-08-10 DIAGNOSIS — J209 Acute bronchitis, unspecified: Secondary | ICD-10-CM | POA: Diagnosis not present

## 2015-08-10 DIAGNOSIS — R05 Cough: Secondary | ICD-10-CM | POA: Diagnosis not present

## 2015-08-17 DIAGNOSIS — I340 Nonrheumatic mitral (valve) insufficiency: Secondary | ICD-10-CM | POA: Diagnosis not present

## 2015-08-17 DIAGNOSIS — E7800 Pure hypercholesterolemia, unspecified: Secondary | ICD-10-CM | POA: Diagnosis not present

## 2015-08-17 DIAGNOSIS — Z23 Encounter for immunization: Secondary | ICD-10-CM | POA: Diagnosis not present

## 2015-08-17 DIAGNOSIS — I25118 Atherosclerotic heart disease of native coronary artery with other forms of angina pectoris: Secondary | ICD-10-CM | POA: Diagnosis not present

## 2015-08-17 DIAGNOSIS — I10 Essential (primary) hypertension: Secondary | ICD-10-CM | POA: Diagnosis not present

## 2015-08-18 DIAGNOSIS — I25118 Atherosclerotic heart disease of native coronary artery with other forms of angina pectoris: Secondary | ICD-10-CM | POA: Diagnosis not present

## 2015-08-21 DIAGNOSIS — I25118 Atherosclerotic heart disease of native coronary artery with other forms of angina pectoris: Secondary | ICD-10-CM | POA: Diagnosis not present

## 2015-08-21 DIAGNOSIS — I5022 Chronic systolic (congestive) heart failure: Secondary | ICD-10-CM | POA: Diagnosis not present

## 2015-08-21 DIAGNOSIS — I10 Essential (primary) hypertension: Secondary | ICD-10-CM | POA: Diagnosis not present

## 2015-08-22 DIAGNOSIS — L821 Other seborrheic keratosis: Secondary | ICD-10-CM | POA: Diagnosis not present

## 2015-08-22 DIAGNOSIS — D485 Neoplasm of uncertain behavior of skin: Secondary | ICD-10-CM | POA: Diagnosis not present

## 2015-08-22 DIAGNOSIS — L820 Inflamed seborrheic keratosis: Secondary | ICD-10-CM | POA: Diagnosis not present

## 2015-10-18 DIAGNOSIS — H353132 Nonexudative age-related macular degeneration, bilateral, intermediate dry stage: Secondary | ICD-10-CM | POA: Diagnosis not present

## 2015-12-05 DIAGNOSIS — E7800 Pure hypercholesterolemia, unspecified: Secondary | ICD-10-CM | POA: Diagnosis not present

## 2015-12-12 DIAGNOSIS — E7800 Pure hypercholesterolemia, unspecified: Secondary | ICD-10-CM | POA: Diagnosis not present

## 2015-12-12 DIAGNOSIS — I5022 Chronic systolic (congestive) heart failure: Secondary | ICD-10-CM | POA: Diagnosis not present

## 2015-12-12 DIAGNOSIS — I10 Essential (primary) hypertension: Secondary | ICD-10-CM | POA: Diagnosis not present

## 2015-12-23 ENCOUNTER — Emergency Department: Payer: Commercial Managed Care - HMO

## 2015-12-23 ENCOUNTER — Emergency Department
Admission: EM | Admit: 2015-12-23 | Discharge: 2015-12-23 | Disposition: A | Discharge status: Home / Self Care | Payer: Commercial Managed Care - HMO | Attending: Emergency Medicine | Admitting: Emergency Medicine

## 2015-12-23 ENCOUNTER — Encounter: Payer: Self-pay | Admitting: Emergency Medicine

## 2015-12-23 DIAGNOSIS — W19XXXA Unspecified fall, initial encounter: Secondary | ICD-10-CM

## 2015-12-23 DIAGNOSIS — Y92009 Unspecified place in unspecified non-institutional (private) residence as the place of occurrence of the external cause: Secondary | ICD-10-CM | POA: Diagnosis not present

## 2015-12-23 DIAGNOSIS — S0083XA Contusion of other part of head, initial encounter: Secondary | ICD-10-CM | POA: Diagnosis not present

## 2015-12-23 DIAGNOSIS — S0990XA Unspecified injury of head, initial encounter: Secondary | ICD-10-CM | POA: Insufficient documentation

## 2015-12-23 DIAGNOSIS — Y998 Other external cause status: Secondary | ICD-10-CM | POA: Insufficient documentation

## 2015-12-23 DIAGNOSIS — S0591XA Unspecified injury of right eye and orbit, initial encounter: Secondary | ICD-10-CM | POA: Diagnosis present

## 2015-12-23 DIAGNOSIS — W01198A Fall on same level from slipping, tripping and stumbling with subsequent striking against other object, initial encounter: Secondary | ICD-10-CM | POA: Insufficient documentation

## 2015-12-23 DIAGNOSIS — S0511XA Contusion of eyeball and orbital tissues, right eye, initial encounter: Secondary | ICD-10-CM | POA: Diagnosis not present

## 2015-12-23 DIAGNOSIS — Y9389 Activity, other specified: Secondary | ICD-10-CM | POA: Diagnosis not present

## 2015-12-23 HISTORY — DX: Old myocardial infarction: I25.2

## 2015-12-23 NOTE — Discharge Instructions (Signed)
Facial or Scalp Contusion  A facial or scalp contusion is a deep bruise on the face or head. Contusions happen when an injury causes bleeding under the skin. Signs of bruising include pain, puffiness (swelling), and discolored skin. The contusion may turn blue, purple, or yellow. HOME CARE  Only take medicines as told by your doctor.  Put ice on the injured area.  Put ice in a plastic bag.  Place a towel between your skin and the bag.  Leave the ice on for 20 minutes, 2-3 times a day. GET HELP IF:  You have bite problems.  You have pain when chewing.  You are worried about your face not healing normally. GET HELP RIGHT AWAY IF:   You have severe pain or a headache and medicine does not help.  You are very tired or confused, or your personality changes.  You throw up (vomit).  You have a nosebleed that will not stop.  You see two of everything (double vision) or have blurry vision.  You have fluid coming from your nose or ear.  You have problems walking or using your arms or legs. MAKE SURE YOU:   Understand these instructions.  Will watch your condition.  Will get help right away if you are not doing well or get worse.   This information is not intended to replace advice given to you by your health care provider. Make sure you discuss any questions you have with your health care provider.   Document Released: 10/10/2011 Document Revised: 11/11/2014 Document Reviewed: 06/03/2013 Elsevier Interactive Patient Education 2016 [employer]  Hematoma A hematoma is a collection of blood. The collection of blood can turn into a hard, painful lump under the skin. Your skin may turn blue or yellow if the hematoma is close to the surface of the skin. Most hematomas get better in a few days to weeks. Some hematomas are serious and need medical care. Hematomas can be very small or very big. HOME CARE  Apply ice to the injured area:  Put ice in a plastic bag.  Place a towel  between your skin and the bag.  Leave the ice on for 20 minutes, 2-3 times a day for the first 1 to 2 days.  After the first 2 days, switch to using warm packs on the injured area.  Raise (elevate) the injured area to lessen pain and puffiness (swelling). You may also wrap the area with an elastic bandage. Make sure the bandage is not wrapped too tight.  If you have a painful hematoma on your leg or foot, you may use crutches for a couple days.  Only take medicines as told by your doctor. GET HELP RIGHT AWAY IF:   Your pain gets worse.  Your pain is not controlled with medicine.  You have a fever.  Your puffiness gets worse.  Your skin turns more blue or yellow.  Your skin over the hematoma breaks or starts bleeding.  Your hematoma is in your chest or belly (abdomen) and you are short of breath, feel weak, or have a change in consciousness.  Your hematoma is on your scalp and you have a headache that gets worse or a change in alertness or consciousness. MAKE SURE YOU:   Understand these instructions.  Will watch your condition.  Will get help right away if you are not doing well or get worse.   This information is not intended to replace advice given to you by your health care provider. Make  sure you discuss any questions you have with your health care provider.   Document Released: 11/28/2004 Document Revised: 06/23/2013 Document Reviewed: 03/31/2013 Elsevier Interactive Patient Education 2016 [place] Injury, Adult You have a head injury. Headaches and throwing up (vomiting) are common after a head injury. It should be easy to wake up from sleeping. Sometimes you must stay in the hospital. Most problems happen within the first 24 hours. Side effects may occur up to 7-10 days after the injury.  WHAT ARE THE TYPES OF HEAD INJURIES? Head injuries can be as minor as a bump. Some head injuries can be more severe. More severe head injuries include:  A jarring  injury to the brain (concussion).  A bruise of the brain (contusion). This mean there is bleeding in the brain that can cause swelling.  A cracked skull (skull fracture).  Bleeding in the brain that collects, clots, and forms a bump (hematoma). WHEN SHOULD I GET HELP RIGHT AWAY?   You are confused or sleepy.  You cannot be woken up.  You feel sick to your stomach (nauseous) or keep throwing up (vomiting).  Your dizziness or unsteadiness is getting worse.  You have very bad, lasting headaches that are not helped by medicine. Take medicines only as told by your doctor.  You cannot use your arms or legs like normal.  You cannot walk.  You notice changes in the black spots in the center of the colored part of your eye (pupil).  You have clear or bloody fluid coming from your nose or ears.  You have trouble seeing. During the next 24 hours after the injury, you must stay with someone who can watch you. This person should get help right away (call 911 in the [place] if you start to shake and are not able to control it (have seizures), you pass out, or you are unable to wake up. HOW CAN I PREVENT A HEAD INJURY IN THE FUTURE?  Wear seat belts.  Wear a helmet while bike riding and playing sports like football.  Stay away from dangerous activities around the house. WHEN CAN I RETURN TO NORMAL ACTIVITIES AND ATHLETICS? See your doctor before doing these activities. You should not do normal activities or play contact sports until 1 week after the following symptoms have stopped:  Headache that does not go away.  Dizziness.  Poor attention.  Confusion.  Memory problems.  Sickness to your stomach or throwing up.  Tiredness.  Fussiness.  Bothered by bright lights or loud noises.  Anxiousness or depression.  Restless sleep. MAKE SURE YOU:   Understand these instructions.  Will watch your condition.  Will get help right away if you are not doing well or get worse.     This information is not intended to replace advice given to you by your health care provider. Make sure you discuss any questions you have with your health care provider.   Document Released: 10/03/2008 Document Revised: 11/11/2014 Document Reviewed: 06/28/2013 Elsevier Interactive Patient Education [employer].  Your exam and head CT are normal today following your fall at home today. Apply ice to the face to reduce pain and swelling. Apply neosporin to the face to promote healing. Follow-up with your provider for ongoing symptoms.

## 2015-12-23 NOTE — ED Notes (Signed)
States fell this am when tripped on step. Hit forehead and has large hematoma and abrasion above R eye. Describes mechanical fall. Denies headache, only sore at hematoma. No LOC. Takes baby ASA per day. Alert oriented man with no deficits noted in triage.

## 2015-12-23 NOTE — ED Provider Notes (Signed)
[facility] Emergency Department Provider Note ____________________________________________  Time seen: 1542  I have reviewed the triage vital signs and the nursing notes.  HISTORY  Chief Complaint  Abrasion  HPI [name] is a 82 y.o. male presents to the ED accompanied by his adult son for evaluation of injuries sustained following a fall at home this morning. The patient describes stepping up from the sunken floorof the storage shed, when his foot got caught on the step. This caused him to lurch forward hitting his head and face on the concrete in front of the car. He denies loss of consciousness, nausea, vomiting, or other injury. He presents today with swelling over the left brow and abrasion to the face in the same region. He describes overall discomfort to his face as a 6/10 in triage.  Past Medical History  Diagnosis Date  . MI, old     There are no active problems to display for this patient.   Past Surgical History  Procedure Laterality Date  . Hemmorroid    . Rotator cuff repair    . Coronary angioplasty with stent placement      No current outpatient prescriptions on file.  Allergies Review of patient's allergies indicates no known allergies.  No family history on file.  Social History Social History  Substance Use Topics  . Smoking status: Never Smoker   . Smokeless tobacco: None  . Alcohol Use: No   Review of Systems  Constitutional: Negative for fever. Eyes: Negative for visual changes. ENT: Negative for sore throat. Cardiovascular: Negative for chest pain. Respiratory: Negative for shortness of breath. Gastrointestinal: Negative for abdominal pain, vomiting and diarrhea. Genitourinary: Negative for dysuria. Musculoskeletal: Negative for back pain. Skin: Negative for rash. Reports of abrasion and hematoma as above. Neurological: Negative for headaches, focal weakness or  numbness. ____________________________________________  PHYSICAL EXAM:  VITAL SIGNS: ED Triage Vitals  Enc Vitals Group     BP 12/23/15 1346 129/65 mmHg     Pulse Rate 12/23/15 1346 71     Resp 12/23/15 1346 18     Temp 12/23/15 1346 98 F (36.7 C)     Temp Source 12/23/15 1346 Oral     SpO2 12/23/15 1346 97 %     Weight 12/23/15 1346 180 lb (81.647 kg)     Height 12/23/15 1346 5\' 11"  (1.803 m)     Head Cir --      Peak Flow --      Pain Score 12/23/15 1348 6     Pain Loc --      Pain Edu? --      Excl. in [place]? --    Constitutional: Alert and oriented. Well appearing and in no distress. Head: Normocephalic and atraumatic except for a large hematoma overlying the right brow. Superficial abrasion is noted locally.      Eyes: Conjunctivae are normal. PERRL. Normal extraocular movements. Normal funduscopic exam bilaterally.      Ears: Canals clear. TMs intact bilaterally.   Nose: No congestion/rhinorrhea.   Mouth/Throat: Mucous membranes are moist.   Neck: Supple. No thyromegaly. Hematological/Lymphatic/Immunological: No cervical lymphadenopathy. Cardiovascular: Normal rate, regular rhythm.  Respiratory: Normal respiratory effort. No wheezes/rales/rhonchi. Gastrointestinal: Soft and nontender. No distention. Musculoskeletal: Nontender with normal range of motion in all extremities.  Neurologic:  Cranial nerves II through XII grossly intact. Normal gait without ataxia. Normal speech and language. No gross focal neurologic deficits are appreciated. Skin:  Skin is warm, dry and intact. No rash  noted. Psychiatric: Mood and affect are normal. Patient exhibits appropriate insight and judgment. ____________________________________________   RADIOLOGY  Head CT  IMPRESSION: No acute intracranial pathology. ____________________________________________  INITIAL IMPRESSION / ASSESSMENT AND PLAN / ED COURSE  Patient with a with a mechanical fall resulting in a facial  contusion and hematoma. No CT evidence of intracranial pathology. Patient is discharged with instructions on managing hematoma and facial abrasions. He is encouraged to dose Tylenol as needed for pain relief. He will apply cool compresses to the face and follow with his primary care provider for ongoing symptom management. Return precautions are reviewed. ____________________________________________  FINAL CLINICAL IMPRESSION(S) / ED DIAGNOSES  Final diagnoses:  Fall at home, initial encounter  Facial contusion, initial encounter  Facial hematoma, initial encounter     Melvenia Needles, PA-C 12/23/15 1618  Nance Pear, MD 12/23/15 [phone]

## 2015-12-23 NOTE — ED Notes (Signed)
NAD noted at time of D/C. Pt denies questions or concerns. Pt ambulatory to the lobby at this time with his family. Pt refused wheelchair to the lobby.

## 2015-12-28 DIAGNOSIS — S0511XA Contusion of eyeball and orbital tissues, right eye, initial encounter: Secondary | ICD-10-CM | POA: Diagnosis not present

## 2016-02-19 DIAGNOSIS — R2681 Unsteadiness on feet: Secondary | ICD-10-CM | POA: Diagnosis not present

## 2016-02-19 DIAGNOSIS — E7800 Pure hypercholesterolemia, unspecified: Secondary | ICD-10-CM | POA: Diagnosis not present

## 2016-02-19 DIAGNOSIS — I2510 Atherosclerotic heart disease of native coronary artery without angina pectoris: Secondary | ICD-10-CM | POA: Diagnosis not present

## 2016-02-19 DIAGNOSIS — I5022 Chronic systolic (congestive) heart failure: Secondary | ICD-10-CM | POA: Diagnosis not present

## 2016-04-08 DIAGNOSIS — I340 Nonrheumatic mitral (valve) insufficiency: Secondary | ICD-10-CM | POA: Diagnosis not present

## 2016-04-08 DIAGNOSIS — I5022 Chronic systolic (congestive) heart failure: Secondary | ICD-10-CM | POA: Diagnosis not present

## 2016-04-08 DIAGNOSIS — I2510 Atherosclerotic heart disease of native coronary artery without angina pectoris: Secondary | ICD-10-CM | POA: Diagnosis not present

## 2016-04-08 DIAGNOSIS — I071 Rheumatic tricuspid insufficiency: Secondary | ICD-10-CM | POA: Diagnosis not present

## 2016-04-08 DIAGNOSIS — I10 Essential (primary) hypertension: Secondary | ICD-10-CM | POA: Diagnosis not present

## 2016-04-11 DIAGNOSIS — I10 Essential (primary) hypertension: Secondary | ICD-10-CM | POA: Diagnosis not present

## 2016-04-11 DIAGNOSIS — I5022 Chronic systolic (congestive) heart failure: Secondary | ICD-10-CM | POA: Diagnosis not present

## 2016-04-11 DIAGNOSIS — E7800 Pure hypercholesterolemia, unspecified: Secondary | ICD-10-CM | POA: Diagnosis not present

## 2016-04-15 ENCOUNTER — Emergency Department: Payer: Commercial Managed Care - HMO

## 2016-04-15 ENCOUNTER — Emergency Department
Admission: EM | Admit: 2016-04-15 | Discharge: 2016-04-15 | Disposition: A | Discharge status: Home / Self Care | Payer: Commercial Managed Care - HMO | Attending: Emergency Medicine | Admitting: Emergency Medicine

## 2016-04-15 DIAGNOSIS — M25531 Pain in right wrist: Secondary | ICD-10-CM | POA: Diagnosis not present

## 2016-04-15 DIAGNOSIS — Z955 Presence of coronary angioplasty implant and graft: Secondary | ICD-10-CM | POA: Insufficient documentation

## 2016-04-15 DIAGNOSIS — S299XXA Unspecified injury of thorax, initial encounter: Secondary | ICD-10-CM | POA: Diagnosis not present

## 2016-04-15 DIAGNOSIS — M25521 Pain in right elbow: Secondary | ICD-10-CM | POA: Diagnosis not present

## 2016-04-15 DIAGNOSIS — S51811A Laceration without foreign body of right forearm, initial encounter: Secondary | ICD-10-CM | POA: Diagnosis not present

## 2016-04-15 DIAGNOSIS — I252 Old myocardial infarction: Secondary | ICD-10-CM | POA: Insufficient documentation

## 2016-04-15 DIAGNOSIS — Y9389 Activity, other specified: Secondary | ICD-10-CM | POA: Insufficient documentation

## 2016-04-15 DIAGNOSIS — Y92410 Unspecified street and highway as the place of occurrence of the external cause: Secondary | ICD-10-CM | POA: Insufficient documentation

## 2016-04-15 DIAGNOSIS — M542 Cervicalgia: Secondary | ICD-10-CM | POA: Diagnosis not present

## 2016-04-15 DIAGNOSIS — Y999 Unspecified external cause status: Secondary | ICD-10-CM | POA: Diagnosis not present

## 2016-04-15 DIAGNOSIS — S3991XA Unspecified injury of abdomen, initial encounter: Secondary | ICD-10-CM | POA: Insufficient documentation

## 2016-04-15 DIAGNOSIS — S0990XA Unspecified injury of head, initial encounter: Secondary | ICD-10-CM | POA: Diagnosis not present

## 2016-04-15 DIAGNOSIS — S199XXA Unspecified injury of neck, initial encounter: Secondary | ICD-10-CM | POA: Diagnosis not present

## 2016-04-15 DIAGNOSIS — S59901A Unspecified injury of right elbow, initial encounter: Secondary | ICD-10-CM | POA: Diagnosis not present

## 2016-04-15 LAB — COMPREHENSIVE METABOLIC PANEL
ALBUMIN: 3.7 g/dL (ref 3.5–5.0)
ALT: 10 U/L — AB (ref 17–63)
AST: 20 U/L (ref 15–41)
Alkaline Phosphatase: 49 U/L (ref 38–126)
Anion gap: 9 (ref 5–15)
BUN: 22 mg/dL — ABNORMAL HIGH (ref 6–20)
CHLORIDE: 107 mmol/L (ref 101–111)
CO2: 22 mmol/L (ref 22–32)
CREATININE: 1.23 mg/dL (ref 0.61–1.24)
Calcium: 8.3 mg/dL — ABNORMAL LOW (ref 8.9–10.3)
GFR calc non Af Amer: 51 mL/min — ABNORMAL LOW (ref >60)
GFR, EST AFRICAN AMERICAN: 59 mL/min — AB (ref >60)
GLUCOSE: 108 mg/dL — AB (ref 65–99)
Potassium: 3.8 mmol/L (ref 3.5–5.1)
SODIUM: 138 mmol/L (ref 135–145)
Total Bilirubin: 0.4 mg/dL (ref 0.3–1.2)
Total Protein: 6.1 g/dL — ABNORMAL LOW (ref 6.5–8.1)

## 2016-04-15 LAB — CBC WITH DIFFERENTIAL/PLATELET
BASOS ABS: 0 10*3/uL (ref 0–0.1)
Basophils Relative: 1 %
Eosinophils Absolute: 0.3 10*3/uL (ref 0–0.7)
Eosinophils Relative: 5 %
HCT: 37.8 % — ABNORMAL LOW (ref 40.0–52.0)
Hemoglobin: 12.8 g/dL — ABNORMAL LOW (ref 13.0–18.0)
LYMPHS ABS: 1.6 10*3/uL (ref 1.0–3.6)
LYMPHS PCT: 24 %
MCH: 31.4 pg (ref 26.0–34.0)
MCHC: 33.9 g/dL (ref 32.0–36.0)
MCV: 92.6 fL (ref 80.0–100.0)
MONOS PCT: 12 %
Monocytes Absolute: 0.8 10*3/uL (ref 0.2–1.0)
NEUTROS ABS: 4 10*3/uL (ref 1.4–6.5)
NEUTROS PCT: 58 %
PLATELETS: 148 10*3/uL — AB (ref 150–440)
RBC: 4.09 MIL/uL — AB (ref 4.40–5.90)
RDW: 14.3 % (ref 11.5–14.5)
WBC: 6.7 10*3/uL (ref 3.8–10.6)

## 2016-04-15 MED ORDER — IOPAMIDOL (ISOVUE-300) INJECTION 61%
100.0000 mL | Freq: Once | INTRAVENOUS | Status: AC | PRN
  Administered 2016-04-15: 100 mL via INTRAVENOUS

## 2016-04-15 NOTE — ED Notes (Signed)
Cleaned pt's skin tear with sterile saline, placed xeroform and wrapped with gauze
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
## 2016-04-15 NOTE — ED Notes (Signed)
Reviewed d/c instructions, follow-up care, use of ice/elevation, and OTC pain meds with pt. Pt verbalized understanding

## 2016-04-17 DIAGNOSIS — S60511D Abrasion of right hand, subsequent encounter: Secondary | ICD-10-CM | POA: Diagnosis not present

## 2016-04-18 DIAGNOSIS — I10 Essential (primary) hypertension: Secondary | ICD-10-CM | POA: Diagnosis not present

## 2016-04-18 DIAGNOSIS — E7800 Pure hypercholesterolemia, unspecified: Secondary | ICD-10-CM | POA: Diagnosis not present

## 2016-04-18 DIAGNOSIS — R7303 Prediabetes: Secondary | ICD-10-CM | POA: Diagnosis not present

## 2016-04-18 DIAGNOSIS — Z0000 Encounter for general adult medical examination without abnormal findings: Secondary | ICD-10-CM | POA: Diagnosis not present

## 2016-04-18 DIAGNOSIS — Z23 Encounter for immunization: Secondary | ICD-10-CM | POA: Diagnosis not present

## 2016-07-10 DIAGNOSIS — I340 Nonrheumatic mitral (valve) insufficiency: Secondary | ICD-10-CM | POA: Diagnosis not present

## 2016-07-10 DIAGNOSIS — I5022 Chronic systolic (congestive) heart failure: Secondary | ICD-10-CM | POA: Diagnosis not present

## 2016-07-10 DIAGNOSIS — I10 Essential (primary) hypertension: Secondary | ICD-10-CM | POA: Diagnosis not present

## 2016-07-10 DIAGNOSIS — E7800 Pure hypercholesterolemia, unspecified: Secondary | ICD-10-CM | POA: Diagnosis not present

## 2016-07-10 DIAGNOSIS — I071 Rheumatic tricuspid insufficiency: Secondary | ICD-10-CM | POA: Diagnosis not present

## 2016-07-10 DIAGNOSIS — I2510 Atherosclerotic heart disease of native coronary artery without angina pectoris: Secondary | ICD-10-CM | POA: Diagnosis not present

## 2016-07-30 DIAGNOSIS — Z23 Encounter for immunization: Secondary | ICD-10-CM | POA: Diagnosis not present

## 2016-08-21 DIAGNOSIS — S52501A Unspecified fracture of the lower end of right radius, initial encounter for closed fracture: Secondary | ICD-10-CM | POA: Diagnosis not present

## 2016-08-21 DIAGNOSIS — M25531 Pain in right wrist: Secondary | ICD-10-CM | POA: Diagnosis not present

## 2016-08-21 DIAGNOSIS — I10 Essential (primary) hypertension: Secondary | ICD-10-CM | POA: Diagnosis not present

## 2016-08-21 DIAGNOSIS — R7303 Prediabetes: Secondary | ICD-10-CM | POA: Diagnosis not present

## 2016-08-21 DIAGNOSIS — E7800 Pure hypercholesterolemia, unspecified: Secondary | ICD-10-CM | POA: Diagnosis not present

## 2016-08-21 DIAGNOSIS — S52511A Displaced fracture of right radial styloid process, initial encounter for closed fracture: Secondary | ICD-10-CM | POA: Diagnosis not present

## 2016-08-27 DIAGNOSIS — S52591A Other fractures of lower end of right radius, initial encounter for closed fracture: Secondary | ICD-10-CM | POA: Diagnosis not present

## 2016-08-27 DIAGNOSIS — M79601 Pain in right arm: Secondary | ICD-10-CM | POA: Diagnosis not present

## 2016-09-03 DIAGNOSIS — S52591D Other fractures of lower end of right radius, subsequent encounter for closed fracture with routine healing: Secondary | ICD-10-CM | POA: Diagnosis not present

## 2016-09-04 DIAGNOSIS — S62101D Fracture of unspecified carpal bone, right wrist, subsequent encounter for fracture with routine healing: Secondary | ICD-10-CM | POA: Diagnosis not present

## 2016-09-04 DIAGNOSIS — I10 Essential (primary) hypertension: Secondary | ICD-10-CM | POA: Diagnosis not present

## 2016-09-04 DIAGNOSIS — E7800 Pure hypercholesterolemia, unspecified: Secondary | ICD-10-CM | POA: Diagnosis not present

## 2016-09-04 DIAGNOSIS — R7303 Prediabetes: Secondary | ICD-10-CM | POA: Diagnosis not present

## 2016-09-25 DIAGNOSIS — S52591D Other fractures of lower end of right radius, subsequent encounter for closed fracture with routine healing: Secondary | ICD-10-CM | POA: Diagnosis not present

## 2016-10-08 DIAGNOSIS — R001 Bradycardia, unspecified: Secondary | ICD-10-CM | POA: Diagnosis not present

## 2016-10-08 DIAGNOSIS — I2510 Atherosclerotic heart disease of native coronary artery without angina pectoris: Secondary | ICD-10-CM | POA: Diagnosis not present

## 2016-10-08 DIAGNOSIS — I10 Essential (primary) hypertension: Secondary | ICD-10-CM | POA: Diagnosis not present

## 2016-10-08 DIAGNOSIS — I5022 Chronic systolic (congestive) heart failure: Secondary | ICD-10-CM | POA: Diagnosis not present

## 2016-10-23 DIAGNOSIS — S52591D Other fractures of lower end of right radius, subsequent encounter for closed fracture with routine healing: Secondary | ICD-10-CM | POA: Diagnosis not present

## 2016-12-02 DIAGNOSIS — J208 Acute bronchitis due to other specified organisms: Secondary | ICD-10-CM | POA: Diagnosis not present

## 2016-12-02 DIAGNOSIS — B9689 Other specified bacterial agents as the cause of diseases classified elsewhere: Secondary | ICD-10-CM | POA: Diagnosis not present

## 2016-12-02 DIAGNOSIS — J209 Acute bronchitis, unspecified: Secondary | ICD-10-CM | POA: Diagnosis not present

## 2016-12-18 DIAGNOSIS — S52591D Other fractures of lower end of right radius, subsequent encounter for closed fracture with routine healing: Secondary | ICD-10-CM | POA: Diagnosis not present

## 2016-12-18 DIAGNOSIS — S63591D Other specified sprain of right wrist, subsequent encounter: Secondary | ICD-10-CM | POA: Diagnosis not present

## 2016-12-26 DIAGNOSIS — E7800 Pure hypercholesterolemia, unspecified: Secondary | ICD-10-CM | POA: Diagnosis not present

## 2016-12-26 DIAGNOSIS — R7303 Prediabetes: Secondary | ICD-10-CM | POA: Diagnosis not present

## 2016-12-26 DIAGNOSIS — I10 Essential (primary) hypertension: Secondary | ICD-10-CM | POA: Diagnosis not present

## 2016-12-31 ENCOUNTER — Emergency Department: Payer: Medicare HMO

## 2016-12-31 ENCOUNTER — Encounter: Payer: Self-pay | Admitting: *Deleted

## 2016-12-31 ENCOUNTER — Emergency Department
Admission: EM | Admit: 2016-12-31 | Discharge: 2016-12-31 | Disposition: A | Discharge status: Home / Self Care | Payer: Medicare HMO | Attending: Emergency Medicine | Admitting: Emergency Medicine

## 2016-12-31 DIAGNOSIS — R609 Edema, unspecified: Secondary | ICD-10-CM | POA: Diagnosis not present

## 2016-12-31 DIAGNOSIS — Z955 Presence of coronary angioplasty implant and graft: Secondary | ICD-10-CM | POA: Insufficient documentation

## 2016-12-31 DIAGNOSIS — M6520 Calcific tendinitis, unspecified site: Secondary | ICD-10-CM | POA: Diagnosis not present

## 2016-12-31 DIAGNOSIS — Z79899 Other long term (current) drug therapy: Secondary | ICD-10-CM | POA: Diagnosis not present

## 2016-12-31 DIAGNOSIS — Z7982 Long term (current) use of aspirin: Secondary | ICD-10-CM | POA: Diagnosis not present

## 2016-12-31 DIAGNOSIS — I509 Heart failure, unspecified: Secondary | ICD-10-CM | POA: Diagnosis not present

## 2016-12-31 DIAGNOSIS — R600 Localized edema: Secondary | ICD-10-CM | POA: Diagnosis not present

## 2016-12-31 DIAGNOSIS — S161XXA Strain of muscle, fascia and tendon at neck level, initial encounter: Secondary | ICD-10-CM | POA: Diagnosis not present

## 2016-12-31 DIAGNOSIS — M542 Cervicalgia: Secondary | ICD-10-CM | POA: Diagnosis not present

## 2016-12-31 DIAGNOSIS — M6528 Calcific tendinitis, other site: Secondary | ICD-10-CM | POA: Diagnosis not present

## 2016-12-31 LAB — COMPREHENSIVE METABOLIC PANEL
ALT: 11 U/L — AB (ref 17–63)
AST: 21 U/L (ref 15–41)
Albumin: 4 g/dL (ref 3.5–5.0)
Alkaline Phosphatase: 72 U/L (ref 38–126)
Anion gap: 8 (ref 5–15)
BILIRUBIN TOTAL: 0.6 mg/dL (ref 0.3–1.2)
BUN: 12 mg/dL (ref 6–20)
CHLORIDE: 105 mmol/L (ref 101–111)
CO2: 26 mmol/L (ref 22–32)
CREATININE: 0.89 mg/dL (ref 0.61–1.24)
Calcium: 8.1 mg/dL — ABNORMAL LOW (ref 8.9–10.3)
GFR calc non Af Amer: >60 mL/min (ref >60)
Glucose, Bld: 109 mg/dL — ABNORMAL HIGH (ref 65–99)
Potassium: 3.2 mmol/L — ABNORMAL LOW (ref 3.5–5.1)
Sodium: 139 mmol/L (ref 135–145)
Total Protein: 6.8 g/dL (ref 6.5–8.1)

## 2016-12-31 LAB — CBC
HCT: 37 % — ABNORMAL LOW (ref 40.0–52.0)
Hemoglobin: 13 g/dL (ref 13.0–18.0)
MCH: 32.4 pg (ref 26.0–34.0)
MCHC: 35.3 g/dL (ref 32.0–36.0)
MCV: 91.8 fL (ref 80.0–100.0)
PLATELETS: 149 10*3/uL — AB (ref 150–440)
RBC: 4.03 MIL/uL — AB (ref 4.40–5.90)
RDW: 14.6 % — ABNORMAL HIGH (ref 11.5–14.5)
WBC: 9.4 10*3/uL (ref 3.8–10.6)

## 2016-12-31 LAB — TROPONIN I

## 2016-12-31 LAB — BRAIN NATRIURETIC PEPTIDE: B Natriuretic Peptide: 481 pg/mL — ABNORMAL HIGH (ref 0.0–100.0)

## 2016-12-31 LAB — LACTIC ACID, PLASMA: LACTIC ACID, VENOUS: 1.1 mmol/L (ref 0.5–1.9)

## 2016-12-31 LAB — INFLUENZA PANEL BY PCR (TYPE A & B)
Influenza A By PCR: NEGATIVE
Influenza B By PCR: NEGATIVE

## 2016-12-31 MED ORDER — LIDOCAINE 5 % EX PTCH
1.0000 | MEDICATED_PATCH | CUTANEOUS | Status: DC
  Administered 2016-12-31: 1 via TRANSDERMAL
  Filled 2016-12-31: qty 1

## 2016-12-31 MED ORDER — LIDOCAINE 5 % EX PTCH: 1.0000 | MEDICATED_PATCH | Freq: Two times a day (BID) | CUTANEOUS | 0 refills | Status: AC

## 2016-12-31 MED ORDER — KETOROLAC TROMETHAMINE 30 MG/ML IJ SOLN
30.0000 mg | Freq: Once | INTRAMUSCULAR | Status: AC
  Administered 2016-12-31: 30 mg via INTRAVENOUS
  Filled 2016-12-31: qty 1

## 2016-12-31 MED ORDER — ETODOLAC 200 MG PO CAPS: 200.0000 mg | ORAL_CAPSULE | Freq: Three times a day (TID) | ORAL | 0 refills | Status: DC

## 2016-12-31 NOTE — ED Provider Notes (Signed)
[facility] Emergency Department Provider Note   ____________________________________________   First MD Initiated Contact with Patient 12/31/16 0037     (approximate)  I have reviewed the triage vital signs and the nursing notes.   HISTORY  Chief Complaint Neck Pain and Headache    HPI [name] is a 83 y.o. male who comes into the hospital today with pain in the back of his neck. He reports that he couldn't maneuver his neck especially to the right. This started yesterday and lingered on. He reports that he was unsure if was coming from his ears or what. He went to bed and tried to relax but he couldn't. The back of his neck Hurting. He thought maybe it was heart related. He took some Tylenol extra strength but it didn't help. He refused to take any more tonight. They also noticed that he had some foot swelling since yesterday so they decided to come in. He denies any history of swelling to his feet and denies having a headache. He reports he is not having neck pain in the past but he has had multiple falls recently. He denies chest pain has some mild shortness of breath no nausea no vomiting no dizziness or lightheadedness. He is here for evaluation.   Past Medical History:  Diagnosis Date  . MI, old     There are no active problems to display for this patient.   Past Surgical History:  Procedure Laterality Date  . CORONARY ANGIOPLASTY WITH STENT PLACEMENT    . hemmorroid    . ROTATOR CUFF REPAIR      Prior to Admission medications   Medication Sig Start Date End Date Taking? Authorizing Provider  acetaminophen (TYLENOL) 500 MG tablet Take 500 mg by mouth every 6 (six) hours as needed.   Yes Historical Provider, MD  aspirin 81 MG tablet Take 81 mg by mouth daily.   Yes Historical Provider, MD  atorvastatin (LIPITOR) 20 MG tablet Take 20 mg by mouth daily.   Yes Historical Provider, MD  carvedilol (COREG) 3.125 MG tablet Take 3.125 mg by  mouth 2 (two) times daily with a meal.   Yes Historical Provider, MD  lisinopril (PRINIVIL,ZESTRIL) 10 MG tablet Take 10 mg by mouth daily.   Yes Historical Provider, MD  omeprazole (PRILOSEC) 20 MG capsule Take 20 mg by mouth daily.   Yes Historical Provider, MD  spironolactone (ALDACTONE) 25 MG tablet Take 25 mg by mouth daily.   Yes Historical Provider, MD  etodolac (LODINE) 200 MG capsule Take 1 capsule (200 mg total) by mouth every 8 (eight) hours. 12/31/16   Loney Hering, MD  lidocaine (LIDODERM) 5 % Place 1 patch onto the skin every 12 (twelve) hours. Remove & Discard patch within 12 hours or as directed by MD 12/31/16 12/31/17  Loney Hering, MD    Allergies Patient has no known allergies.  No family history on file.  Social History Social History  Substance Use Topics  . Smoking status: Never Smoker  . Smokeless tobacco: Never Used  . Alcohol use No    Review of Systems Constitutional: No fever/chills Eyes: No visual changes. ENT: No sore throat. Cardiovascular: Denies chest pain. Respiratory: Denies shortness of breath. Gastrointestinal: No abdominal pain.  No nausea, no vomiting.  No diarrhea.  No constipation. Genitourinary: Negative for dysuria. Musculoskeletal: neck pain Skin: Negative for rash. Neurological: Negative for headaches, focal weakness or numbness. Hematological/Lymphatic:bilateral foot swelling  10-point ROS otherwise negative.  ____________________________________________   PHYSICAL EXAM:  VITAL SIGNS: ED Triage Vitals  Enc Vitals Group     BP 12/31/16 0020 (!) 156/76     Pulse Rate 12/31/16 0020 72     Resp 12/31/16 0020 20     Temp 12/31/16 0024 98.4 F (36.9 C)     Temp Source 12/31/16 0020 Oral     SpO2 12/31/16 0020 96 %     Weight 12/31/16 0020 174 lb (78.9 kg)     Height 12/31/16 0020 5\' 10"  (1.778 m)     Head Circumference --      Peak Flow --      Pain Score 12/31/16 0021 6     Pain Loc --      Pain Edu? --       Excl. in [place]? --     Constitutional: Alert and oriented. Well appearing and in moderate distress. Eyes: Conjunctivae are normal. PERRL. EOMI. Head: Atraumatic. Nose: No congestion/rhinnorhea. Mouth/Throat: Mucous membranes are moist.  Oropharynx non-erythematous. Neck:  cervical spine tenderness to palpation. Cardiovascular: Normal rate, regular rhythm. Grossly normal heart sounds.  Good peripheral circulation. Respiratory: Normal respiratory effort.  No retractions. Lungs CTAB. Gastrointestinal: Soft and nontender. No distention. Positive bowel sounds Musculoskeletal: No lower extremity tenderness, mild lower extremity edema   Neurologic:  Normal speech and language.  Skin:  Skin is warm, dry and intact.  Psychiatric: Mood and affect are normal.  ____________________________________________   LABS (all labs ordered are listed, but only abnormal results are displayed)  Labs Reviewed  CBC - Abnormal; Notable for the following:       Result Value   RBC 4.03 (*)    HCT 37.0 (*)    RDW 14.6 (*)    Platelets 149 (*)    All other components within normal limits  COMPREHENSIVE METABOLIC PANEL - Abnormal; Notable for the following:    Potassium 3.2 (*)    Glucose, Bld 109 (*)    Calcium 8.1 (*)    ALT 11 (*)    All other components within normal limits  BRAIN NATRIURETIC PEPTIDE - Abnormal; Notable for the following:    B Natriuretic Peptide 481.0 (*)    All other components within normal limits  TROPONIN I  LACTIC ACID, PLASMA  INFLUENZA PANEL BY PCR (TYPE A & B)  LACTIC ACID, PLASMA   ____________________________________________  EKG  none ____________________________________________  RADIOLOGY  CT cervical spine ____________________________________________   PROCEDURES  Procedure(s) performed: None  Procedures  Critical Care performed: No  ____________________________________________   INITIAL IMPRESSION / ASSESSMENT AND PLAN / ED COURSE  Pertinent labs  & imaging results that were available during my care of the patient were reviewed by me and considered in my medical decision making (see chart for details).  This is an 83 year old male who comes into the hospital today with some neck pain. It is been bothering him over the last day or 2. Tylenol did not help for this pain at home. He was concerned it may be related to his heart because he also had some swelling in his legs. The patient did have a CT scan that showed he had some calcific tendinitis of the longus coli as well as some mild to severe canal stenosis C4-C7. I did give the patient a shot of Toradol as well as a Lidoderm patch to his neck. I will give the patient some pain medicine for home as well as some Lidoderm patches. I will have him follow-up with  orthopedic surgery. He does have an appointment with his doctor on Thursday which I think would be an appropriate place to discuss his leg swelling. He does have a history of congestive heart failure and is on spironolactone. He may need to increase his dose of spironolactone. The patient otherwise has no other complaints and he'll be discharged home.  Clinical Course as of Dec 31 316  Tue Dec 31, 2016  0220 CT findings of acute calcific longus coli tendinitis.  Stable grade 1 C3-4 anterolisthesis without spondylolysis.  Mild canal stenosis C4-5 and C6-7. Severe C4-5 through C6-7 neural foraminal narrowing.   CT Cervical Spine Wo Contrast [AW]    Clinical Course User Index [AW] Loney Hering, MD     ____________________________________________   FINAL CLINICAL IMPRESSION(S) / ED DIAGNOSES  Final diagnoses:  Neck pain  Calcific tendinitis  Edema, unspecified type      NEW MEDICATIONS STARTED DURING THIS VISIT:  New Prescriptions   ETODOLAC (LODINE) 200 MG CAPSULE    Take 1 capsule (200 mg total) by mouth every 8 (eight) hours.   LIDOCAINE (LIDODERM) 5 %    Place 1 patch onto the skin every 12 (twelve) hours.  Remove & Discard patch within 12 hours or as directed by MD     Note:  This document was prepared using Dragon voice recognition software and may include unintentional dictation errors.    Loney Hering, MD 12/31/16 [phone]

## 2016-12-31 NOTE — ED Notes (Signed)
Patient transported to CT 

## 2016-12-31 NOTE — ED Triage Notes (Addendum)
Pt to triage via wheelchair.  Pt reports neck pain since noon yesterday and today pt woke up with headache and neck pain.  Pt rook tylenol last night without relief.  No n/v/d.  Pt alert.  Speech clear.

## 2016-12-31 NOTE — ED Notes (Signed)
Gave pt blanket

## 2016-12-31 NOTE — ED Notes (Signed)
Per pt and family members, states pt has hx of leaning to the right as well as "shakiness" to bilateral arms. When asked to verify if this is not new symptoms both pt and wife state they are not.

## 2017-01-02 DIAGNOSIS — R7303 Prediabetes: Secondary | ICD-10-CM | POA: Diagnosis not present

## 2017-01-02 DIAGNOSIS — E7800 Pure hypercholesterolemia, unspecified: Secondary | ICD-10-CM | POA: Diagnosis not present

## 2017-01-02 DIAGNOSIS — M542 Cervicalgia: Secondary | ICD-10-CM | POA: Diagnosis not present

## 2017-01-02 DIAGNOSIS — I10 Essential (primary) hypertension: Secondary | ICD-10-CM | POA: Diagnosis not present

## 2017-01-02 DIAGNOSIS — I5022 Chronic systolic (congestive) heart failure: Secondary | ICD-10-CM | POA: Diagnosis not present

## 2017-01-08 DIAGNOSIS — M542 Cervicalgia: Secondary | ICD-10-CM | POA: Diagnosis not present

## 2017-01-13 DIAGNOSIS — M542 Cervicalgia: Secondary | ICD-10-CM | POA: Diagnosis not present

## 2017-01-15 DIAGNOSIS — M542 Cervicalgia: Secondary | ICD-10-CM | POA: Diagnosis not present

## 2017-01-21 DIAGNOSIS — M542 Cervicalgia: Secondary | ICD-10-CM | POA: Diagnosis not present

## 2017-01-28 DIAGNOSIS — M542 Cervicalgia: Secondary | ICD-10-CM | POA: Diagnosis not present

## 2017-02-03 DIAGNOSIS — M542 Cervicalgia: Secondary | ICD-10-CM | POA: Diagnosis not present

## 2017-02-03 DIAGNOSIS — G301 Alzheimer's disease with late onset: Secondary | ICD-10-CM | POA: Diagnosis not present

## 2017-02-03 DIAGNOSIS — F0280 Dementia in other diseases classified elsewhere without behavioral disturbance: Secondary | ICD-10-CM | POA: Diagnosis not present

## 2017-02-20 DIAGNOSIS — H353131 Nonexudative age-related macular degeneration, bilateral, early dry stage: Secondary | ICD-10-CM | POA: Diagnosis not present

## 2017-02-20 DIAGNOSIS — H04221 Epiphora due to insufficient drainage, right lacrimal gland: Secondary | ICD-10-CM | POA: Diagnosis not present

## 2017-03-19 DIAGNOSIS — G301 Alzheimer's disease with late onset: Secondary | ICD-10-CM | POA: Diagnosis not present

## 2017-03-19 DIAGNOSIS — I10 Essential (primary) hypertension: Secondary | ICD-10-CM | POA: Diagnosis not present

## 2017-03-19 DIAGNOSIS — F0280 Dementia in other diseases classified elsewhere without behavioral disturbance: Secondary | ICD-10-CM | POA: Diagnosis not present

## 2017-03-19 DIAGNOSIS — R7303 Prediabetes: Secondary | ICD-10-CM | POA: Diagnosis not present

## 2017-03-19 DIAGNOSIS — E7800 Pure hypercholesterolemia, unspecified: Secondary | ICD-10-CM | POA: Diagnosis not present

## 2017-04-17 DIAGNOSIS — I5022 Chronic systolic (congestive) heart failure: Secondary | ICD-10-CM | POA: Diagnosis not present

## 2017-04-17 DIAGNOSIS — R55 Syncope and collapse: Secondary | ICD-10-CM | POA: Diagnosis not present

## 2017-04-17 DIAGNOSIS — I2510 Atherosclerotic heart disease of native coronary artery without angina pectoris: Secondary | ICD-10-CM | POA: Diagnosis not present

## 2017-04-17 DIAGNOSIS — I10 Essential (primary) hypertension: Secondary | ICD-10-CM | POA: Diagnosis not present

## 2017-04-24 DIAGNOSIS — R55 Syncope and collapse: Secondary | ICD-10-CM | POA: Diagnosis not present

## 2017-05-15 DIAGNOSIS — E7800 Pure hypercholesterolemia, unspecified: Secondary | ICD-10-CM | POA: Diagnosis not present

## 2017-05-15 DIAGNOSIS — R7303 Prediabetes: Secondary | ICD-10-CM | POA: Diagnosis not present

## 2017-05-15 DIAGNOSIS — I10 Essential (primary) hypertension: Secondary | ICD-10-CM | POA: Diagnosis not present

## 2017-05-19 DIAGNOSIS — I5022 Chronic systolic (congestive) heart failure: Secondary | ICD-10-CM | POA: Diagnosis not present

## 2017-05-19 DIAGNOSIS — R55 Syncope and collapse: Secondary | ICD-10-CM | POA: Diagnosis not present

## 2017-05-19 DIAGNOSIS — I471 Supraventricular tachycardia: Secondary | ICD-10-CM | POA: Diagnosis not present

## 2017-05-19 DIAGNOSIS — I10 Essential (primary) hypertension: Secondary | ICD-10-CM | POA: Diagnosis not present

## 2017-05-19 DIAGNOSIS — I2510 Atherosclerotic heart disease of native coronary artery without angina pectoris: Secondary | ICD-10-CM | POA: Diagnosis not present

## 2017-05-22 DIAGNOSIS — R7303 Prediabetes: Secondary | ICD-10-CM | POA: Diagnosis not present

## 2017-05-22 DIAGNOSIS — H9193 Unspecified hearing loss, bilateral: Secondary | ICD-10-CM | POA: Diagnosis not present

## 2017-05-22 DIAGNOSIS — E7800 Pure hypercholesterolemia, unspecified: Secondary | ICD-10-CM | POA: Diagnosis not present

## 2017-05-22 DIAGNOSIS — Z862 Personal history of diseases of the blood and blood-forming organs and certain disorders involving the immune mechanism: Secondary | ICD-10-CM | POA: Diagnosis not present

## 2017-05-22 DIAGNOSIS — Z0000 Encounter for general adult medical examination without abnormal findings: Secondary | ICD-10-CM | POA: Diagnosis not present

## 2017-07-29 DIAGNOSIS — K589 Irritable bowel syndrome without diarrhea: Secondary | ICD-10-CM | POA: Diagnosis not present

## 2017-08-12 DIAGNOSIS — Z23 Encounter for immunization: Secondary | ICD-10-CM | POA: Diagnosis not present

## 2017-08-25 DIAGNOSIS — S29012A Strain of muscle and tendon of back wall of thorax, initial encounter: Secondary | ICD-10-CM | POA: Diagnosis not present

## 2017-09-11 DIAGNOSIS — R7303 Prediabetes: Secondary | ICD-10-CM | POA: Diagnosis not present

## 2017-09-11 DIAGNOSIS — Z862 Personal history of diseases of the blood and blood-forming organs and certain disorders involving the immune mechanism: Secondary | ICD-10-CM | POA: Diagnosis not present

## 2017-09-11 DIAGNOSIS — E7800 Pure hypercholesterolemia, unspecified: Secondary | ICD-10-CM | POA: Diagnosis not present

## 2017-09-18 DIAGNOSIS — F0280 Dementia in other diseases classified elsewhere without behavioral disturbance: Secondary | ICD-10-CM | POA: Diagnosis not present

## 2017-09-18 DIAGNOSIS — Z862 Personal history of diseases of the blood and blood-forming organs and certain disorders involving the immune mechanism: Secondary | ICD-10-CM | POA: Diagnosis not present

## 2017-09-18 DIAGNOSIS — G301 Alzheimer's disease with late onset: Secondary | ICD-10-CM | POA: Diagnosis not present

## 2017-09-18 DIAGNOSIS — R7303 Prediabetes: Secondary | ICD-10-CM | POA: Diagnosis not present

## 2017-09-18 DIAGNOSIS — E7800 Pure hypercholesterolemia, unspecified: Secondary | ICD-10-CM | POA: Diagnosis not present

## 2017-09-18 DIAGNOSIS — I10 Essential (primary) hypertension: Secondary | ICD-10-CM | POA: Diagnosis not present

## 2017-12-01 DIAGNOSIS — I5022 Chronic systolic (congestive) heart failure: Secondary | ICD-10-CM | POA: Diagnosis not present

## 2017-12-01 DIAGNOSIS — E7800 Pure hypercholesterolemia, unspecified: Secondary | ICD-10-CM | POA: Diagnosis not present

## 2017-12-01 DIAGNOSIS — I2510 Atherosclerotic heart disease of native coronary artery without angina pectoris: Secondary | ICD-10-CM | POA: Diagnosis not present

## 2017-12-01 DIAGNOSIS — I340 Nonrheumatic mitral (valve) insufficiency: Secondary | ICD-10-CM | POA: Diagnosis not present

## 2017-12-09 DIAGNOSIS — K219 Gastro-esophageal reflux disease without esophagitis: Secondary | ICD-10-CM | POA: Diagnosis not present

## 2017-12-09 DIAGNOSIS — R197 Diarrhea, unspecified: Secondary | ICD-10-CM | POA: Diagnosis not present

## 2018-03-11 DIAGNOSIS — I10 Essential (primary) hypertension: Secondary | ICD-10-CM | POA: Diagnosis not present

## 2018-03-11 DIAGNOSIS — E7800 Pure hypercholesterolemia, unspecified: Secondary | ICD-10-CM | POA: Diagnosis not present

## 2018-03-11 DIAGNOSIS — R7303 Prediabetes: Secondary | ICD-10-CM | POA: Diagnosis not present

## 2018-03-11 DIAGNOSIS — Z862 Personal history of diseases of the blood and blood-forming organs and certain disorders involving the immune mechanism: Secondary | ICD-10-CM | POA: Diagnosis not present

## 2018-03-18 DIAGNOSIS — Z862 Personal history of diseases of the blood and blood-forming organs and certain disorders involving the immune mechanism: Secondary | ICD-10-CM | POA: Diagnosis not present

## 2018-03-18 DIAGNOSIS — F0280 Dementia in other diseases classified elsewhere without behavioral disturbance: Secondary | ICD-10-CM | POA: Diagnosis not present

## 2018-03-18 DIAGNOSIS — E7800 Pure hypercholesterolemia, unspecified: Secondary | ICD-10-CM | POA: Diagnosis not present

## 2018-03-18 DIAGNOSIS — I10 Essential (primary) hypertension: Secondary | ICD-10-CM | POA: Diagnosis not present

## 2018-03-18 DIAGNOSIS — R05 Cough: Secondary | ICD-10-CM | POA: Diagnosis not present

## 2018-03-18 DIAGNOSIS — Z0000 Encounter for general adult medical examination without abnormal findings: Secondary | ICD-10-CM | POA: Diagnosis not present

## 2018-03-18 DIAGNOSIS — G301 Alzheimer's disease with late onset: Secondary | ICD-10-CM | POA: Diagnosis not present

## 2018-03-18 DIAGNOSIS — R7303 Prediabetes: Secondary | ICD-10-CM | POA: Diagnosis not present

## 2018-03-26 DIAGNOSIS — H04221 Epiphora due to insufficient drainage, right lacrimal gland: Secondary | ICD-10-CM | POA: Diagnosis not present

## 2018-03-31 DIAGNOSIS — E7800 Pure hypercholesterolemia, unspecified: Secondary | ICD-10-CM | POA: Diagnosis not present

## 2018-03-31 DIAGNOSIS — I471 Supraventricular tachycardia: Secondary | ICD-10-CM | POA: Diagnosis not present

## 2018-03-31 DIAGNOSIS — S6991XA Unspecified injury of right wrist, hand and finger(s), initial encounter: Secondary | ICD-10-CM | POA: Diagnosis not present

## 2018-03-31 DIAGNOSIS — I2510 Atherosclerotic heart disease of native coronary artery without angina pectoris: Secondary | ICD-10-CM | POA: Diagnosis not present

## 2018-03-31 DIAGNOSIS — R7303 Prediabetes: Secondary | ICD-10-CM | POA: Diagnosis not present

## 2018-03-31 DIAGNOSIS — I071 Rheumatic tricuspid insufficiency: Secondary | ICD-10-CM | POA: Diagnosis not present

## 2018-03-31 DIAGNOSIS — I340 Nonrheumatic mitral (valve) insufficiency: Secondary | ICD-10-CM | POA: Diagnosis not present

## 2018-03-31 DIAGNOSIS — M20011 Mallet finger of right finger(s): Secondary | ICD-10-CM | POA: Diagnosis not present

## 2018-03-31 DIAGNOSIS — I10 Essential (primary) hypertension: Secondary | ICD-10-CM | POA: Diagnosis not present

## 2018-03-31 DIAGNOSIS — S62632A Displaced fracture of distal phalanx of right middle finger, initial encounter for closed fracture: Secondary | ICD-10-CM | POA: Diagnosis not present

## 2018-04-06 DIAGNOSIS — S62632B Displaced fracture of distal phalanx of right middle finger, initial encounter for open fracture: Secondary | ICD-10-CM | POA: Diagnosis not present

## 2018-04-20 DIAGNOSIS — S62632D Displaced fracture of distal phalanx of right middle finger, subsequent encounter for fracture with routine healing: Secondary | ICD-10-CM | POA: Diagnosis not present

## 2018-05-26 DIAGNOSIS — H35051 Retinal neovascularization, unspecified, right eye: Secondary | ICD-10-CM | POA: Diagnosis not present

## 2018-05-26 DIAGNOSIS — H353131 Nonexudative age-related macular degeneration, bilateral, early dry stage: Secondary | ICD-10-CM | POA: Diagnosis not present

## 2018-06-05 ENCOUNTER — Observation Stay
Admission: EM | Admit: 2018-06-05 | Discharge: 2018-06-06 | Disposition: A | Discharge status: Home / Self Care | Payer: Medicare HMO | Attending: Internal Medicine | Admitting: Internal Medicine

## 2018-06-05 ENCOUNTER — Emergency Department: Payer: Medicare HMO

## 2018-06-05 ENCOUNTER — Encounter: Payer: Self-pay | Admitting: Radiology

## 2018-06-05 ENCOUNTER — Observation Stay: Payer: Medicare HMO

## 2018-06-05 DIAGNOSIS — R072 Precordial pain: Secondary | ICD-10-CM | POA: Diagnosis not present

## 2018-06-05 DIAGNOSIS — I4510 Unspecified right bundle-branch block: Secondary | ICD-10-CM | POA: Insufficient documentation

## 2018-06-05 DIAGNOSIS — Z955 Presence of coronary angioplasty implant and graft: Secondary | ICD-10-CM | POA: Diagnosis not present

## 2018-06-05 DIAGNOSIS — R29818 Other symptoms and signs involving the nervous system: Secondary | ICD-10-CM | POA: Diagnosis not present

## 2018-06-05 DIAGNOSIS — I5022 Chronic systolic (congestive) heart failure: Secondary | ICD-10-CM | POA: Insufficient documentation

## 2018-06-05 DIAGNOSIS — I452 Bifascicular block: Secondary | ICD-10-CM | POA: Diagnosis not present

## 2018-06-05 DIAGNOSIS — R531 Weakness: Secondary | ICD-10-CM | POA: Diagnosis not present

## 2018-06-05 DIAGNOSIS — R079 Chest pain, unspecified: Secondary | ICD-10-CM | POA: Diagnosis not present

## 2018-06-05 DIAGNOSIS — I420 Dilated cardiomyopathy: Secondary | ICD-10-CM | POA: Diagnosis not present

## 2018-06-05 DIAGNOSIS — Z8249 Family history of ischemic heart disease and other diseases of the circulatory system: Secondary | ICD-10-CM | POA: Diagnosis not present

## 2018-06-05 DIAGNOSIS — I110 Hypertensive heart disease with heart failure: Secondary | ICD-10-CM | POA: Diagnosis not present

## 2018-06-05 DIAGNOSIS — I7389 Other specified peripheral vascular diseases: Secondary | ICD-10-CM | POA: Insufficient documentation

## 2018-06-05 DIAGNOSIS — I252 Old myocardial infarction: Secondary | ICD-10-CM | POA: Insufficient documentation

## 2018-06-05 DIAGNOSIS — R0789 Other chest pain: Secondary | ICD-10-CM | POA: Diagnosis not present

## 2018-06-05 DIAGNOSIS — Z79899 Other long term (current) drug therapy: Secondary | ICD-10-CM | POA: Diagnosis not present

## 2018-06-05 DIAGNOSIS — R202 Paresthesia of skin: Secondary | ICD-10-CM

## 2018-06-05 DIAGNOSIS — E782 Mixed hyperlipidemia: Secondary | ICD-10-CM | POA: Insufficient documentation

## 2018-06-05 DIAGNOSIS — I2510 Atherosclerotic heart disease of native coronary artery without angina pectoris: Secondary | ICD-10-CM | POA: Diagnosis not present

## 2018-06-05 DIAGNOSIS — I700 Atherosclerosis of aorta: Secondary | ICD-10-CM | POA: Insufficient documentation

## 2018-06-05 DIAGNOSIS — Z7982 Long term (current) use of aspirin: Secondary | ICD-10-CM | POA: Insufficient documentation

## 2018-06-05 DIAGNOSIS — I10 Essential (primary) hypertension: Secondary | ICD-10-CM | POA: Diagnosis not present

## 2018-06-05 LAB — TROPONIN I
Troponin I: <0.03 ng/mL (ref <0.03)
Troponin I: <0.03 ng/mL (ref <0.03)

## 2018-06-05 LAB — COMPREHENSIVE METABOLIC PANEL
ALBUMIN: 3.9 g/dL (ref 3.5–5.0)
ALT: 15 U/L (ref 0–44)
AST: 25 U/L (ref 15–41)
Alkaline Phosphatase: 68 U/L (ref 38–126)
Anion gap: 8 (ref 5–15)
BUN: 23 mg/dL (ref 8–23)
CHLORIDE: 107 mmol/L (ref 98–111)
CO2: 26 mmol/L (ref 22–32)
Calcium: 8.6 mg/dL — ABNORMAL LOW (ref 8.9–10.3)
Creatinine, Ser: 1.03 mg/dL (ref 0.61–1.24)
GFR calc Af Amer: >60 mL/min (ref >60)
GFR calc non Af Amer: >60 mL/min (ref >60)
GLUCOSE: 150 mg/dL — AB (ref 70–99)
POTASSIUM: 4.1 mmol/L (ref 3.5–5.1)
SODIUM: 141 mmol/L (ref 135–145)
TOTAL PROTEIN: 6.5 g/dL (ref 6.5–8.1)
Total Bilirubin: 0.5 mg/dL (ref 0.3–1.2)

## 2018-06-05 LAB — CBC WITH DIFFERENTIAL/PLATELET
BASOS ABS: 0.1 10*3/uL (ref 0–0.1)
BASOS PCT: 1 %
EOS PCT: 5 %
Eosinophils Absolute: 0.4 10*3/uL (ref 0–0.7)
HCT: 37.2 % — ABNORMAL LOW (ref 40.0–52.0)
Hemoglobin: 13 g/dL (ref 13.0–18.0)
LYMPHS PCT: 26 %
Lymphs Abs: 1.8 10*3/uL (ref 1.0–3.6)
MCH: 33.2 pg (ref 26.0–34.0)
MCHC: 35 g/dL (ref 32.0–36.0)
MCV: 94.9 fL (ref 80.0–100.0)
MONO ABS: 0.7 10*3/uL (ref 0.2–1.0)
Monocytes Relative: 10 %
Neutro Abs: 4 10*3/uL (ref 1.4–6.5)
Neutrophils Relative %: 58 %
PLATELETS: 156 10*3/uL (ref 150–440)
RBC: 3.92 MIL/uL — ABNORMAL LOW (ref 4.40–5.90)
RDW: 14.3 % (ref 11.5–14.5)
WBC: 6.9 10*3/uL (ref 3.8–10.6)

## 2018-06-05 MED ORDER — SPIRONOLACTONE 25 MG PO TABS
25.0000 mg | ORAL_TABLET | Freq: Every day | ORAL | Status: DC
  Administered 2018-06-06: 25 mg via ORAL
  Filled 2018-06-05: qty 1

## 2018-06-05 MED ORDER — ASPIRIN EC 81 MG PO TBEC
81.0000 mg | DELAYED_RELEASE_TABLET | Freq: Every day | ORAL | Status: DC
  Administered 2018-06-06: 81 mg via ORAL
  Filled 2018-06-05: qty 1

## 2018-06-05 MED ORDER — ENOXAPARIN SODIUM 40 MG/0.4ML ~~LOC~~ SOLN: 40.0000 mg | SUBCUTANEOUS | Status: DC

## 2018-06-05 MED ORDER — GI COCKTAIL ~~LOC~~
30.0000 mL | Freq: Once | ORAL | Status: AC
  Administered 2018-06-05: 30 mL via ORAL
  Filled 2018-06-05: qty 30

## 2018-06-05 MED ORDER — CARVEDILOL 3.125 MG PO TABS
3.1250 mg | ORAL_TABLET | Freq: Two times a day (BID) | ORAL | Status: DC
  Administered 2018-06-06: 3.125 mg via ORAL
  Filled 2018-06-05: qty 1

## 2018-06-05 MED ORDER — ATORVASTATIN CALCIUM 20 MG PO TABS
20.0000 mg | ORAL_TABLET | Freq: Every day | ORAL | Status: DC
  Filled 2018-06-05: qty 1

## 2018-06-05 MED ORDER — PANTOPRAZOLE SODIUM 40 MG PO TBEC
40.0000 mg | DELAYED_RELEASE_TABLET | Freq: Every day | ORAL | Status: DC
Start: 2018-06-05 — End: 2018-06-06
  Administered 2018-06-06: 40 mg via ORAL
  Filled 2018-06-05: qty 1

## 2018-06-05 MED ORDER — LISINOPRIL 10 MG PO TABS
10.0000 mg | ORAL_TABLET | Freq: Every day | ORAL | Status: DC
  Filled 2018-06-05: qty 1

## 2018-06-05 MED ORDER — ACETAMINOPHEN 325 MG PO TABS: 650.0000 mg | ORAL_TABLET | ORAL | Status: DC | PRN

## 2018-06-05 MED ORDER — IOPAMIDOL (ISOVUE-370) INJECTION 76%
75.0000 mL | Freq: Once | INTRAVENOUS | Status: AC | PRN
  Administered 2018-06-05: 75 mL via INTRAVENOUS

## 2018-06-05 MED ORDER — ONDANSETRON HCL 4 MG/2ML IJ SOLN: 4.0000 mg | Freq: Four times a day (QID) | INTRAMUSCULAR | Status: DC | PRN

## 2018-06-05 NOTE — ED Triage Notes (Signed)
  Pt presents to the ED Via [facility] from home for complaints of Mid sternal pain described as pressure that  left arm tingling. Pt take asprin daily  hx of MI 78yrs   12 URMK  138/62 66 2L 98 Cap 36  Meds given in transit: Nitro spray  324 asprin

## 2018-06-05 NOTE — ED Notes (Addendum)
MRI [name] states probable 40(+/-)min until pt to MRI for scan. ED charge aware, who also updated floor of wait time and plan. Per ED charge, pt to transport upstairs because of department need of bed due to current waiting status in ER

## 2018-06-05 NOTE — ED Notes (Signed)
Transport to room 247.As

## 2018-06-05 NOTE — ED Notes (Signed)
.   Pt is resting, Respirations even and unlabored, NAD. Stretcher lowest postion and locked. Call bell within reach. Denies any needs at this time RN will continue to monitor.    

## 2018-06-05 NOTE — ED Notes (Signed)
Allison RN, aware of bed assigned  

## 2018-06-05 NOTE — H&P (Addendum)
[place] [facility] [place] NAME: [name]    MR#:  [id number]  DATE OF BIRTH:  August 14, 1927  DATE OF ADMISSION:  06/05/2018  PRIMARY CARE PHYSICIAN: Dion Body, MD   REQUESTING/REFERRING PHYSICIAN: Merlyn Lot, MD  CHIEF COMPLAINT:   Chief Complaint  Patient presents with  . Chest Pain    HISTORY OF PRESENT ILLNESS:  [name]  is a 84 y.o. male with a known history of CAD s/p stent (1990), HTN, HFrEF (EF 35%), and HLD presenting  to the ED with substernal chest pain that started today at 2 PM. The chest pain woke him up from his afternoon nap.  The pain radiates to the left side of his chest.  The pain is associated with mild diaphoresis.  The pain is not exertional.  The pain resolved with nitro given in the ED.  He denies any nausea or shortness of breath.  He also endorses left arm and leg weakness over the last year.  No numbness or tingling.  No facial weakness.  No vision changes.  PAST MEDICAL HISTORY:   Past Medical History:  Diagnosis Date  . MI, old     PAST SURGICAL HISTORY:   Past Surgical History:  Procedure Laterality Date  . CORONARY ANGIOPLASTY WITH STENT PLACEMENT    . hemmorroid    . ROTATOR CUFF REPAIR      SOCIAL HISTORY:   Social History   Tobacco Use  . Smoking status: Never Smoker  . Smokeless tobacco: Never Used  Substance Use Topics  . Alcohol use: No    FAMILY HISTORY:  Father- heart disease  DRUG ALLERGIES:  No Known Allergies  REVIEW OF SYSTEMS:   Review of Systems  Constitutional: Negative for chills and fever.  HENT: Negative for congestion and sore throat.   Eyes: Negative for blurred vision and double vision.  Respiratory: Negative for cough and shortness of breath.   Cardiovascular: Positive for chest pain. Negative for palpitations and leg swelling.  Gastrointestinal: Negative for constipation, diarrhea, nausea and vomiting.  Genitourinary: Negative for  dysuria and frequency.  Musculoskeletal: Positive for joint pain. Negative for back pain and neck pain.  Neurological: Negative for dizziness and headaches.  Psychiatric/Behavioral: Negative for depression. The patient is not nervous/anxious.       MEDICATIONS AT HOME:   Prior to Admission medications   Medication Sig Start Date End Date Taking? Authorizing Provider  aspirin 81 MG tablet Take 81 mg by mouth daily.   Yes [provider]  atorvastatin (LIPITOR) 20 MG tablet Take 20 mg by mouth daily.   Yes [provider]  carvedilol (COREG) 3.125 MG tablet Take 3.125 mg by mouth 2 (two) times daily with a meal.   Yes [provider]  lisinopril (PRINIVIL,ZESTRIL) 10 MG tablet Take 10 mg by mouth daily.   Yes [provider]  omeprazole (PRILOSEC) 20 MG capsule Take 20 mg by mouth daily.   Yes [provider]  spironolactone (ALDACTONE) 25 MG tablet Take 25 mg by mouth daily.   Yes [provider]  acetaminophen (TYLENOL) 500 MG tablet Take 500 mg by mouth every 6 (six) hours as needed for mild pain.     [provider]  etodolac (LODINE) 200 MG capsule Take 1 capsule (200 mg total) by mouth every 8 (eight) hours. Patient not taking: Reported on 06/05/2018 12/31/16   Loney Hering, MD      VITAL SIGNS:  Blood  pressure 121/62, pulse (!) 57, temperature 97.6 F (36.4 C), temperature source Oral, resp. rate 13, height 6' (1.829 m), weight 78.9 kg (174 lb), SpO2 100 %.  PHYSICAL EXAMINATION:  Physical Exam  GENERAL:  84 y.o.-year-old patient lying in the bed with no acute distress, pleasant EYES: Pupils equal, round, reactive to light and accommodation. No scleral icterus. Extraocular muscles intact.  HEENT: Head atraumatic, normocephalic. Oropharynx and nasopharynx clear.  NECK:  Supple, no jugular venous distention. No thyroid enlargement, no tenderness.  LUNGS: Normal breath sounds bilaterally, no wheezing, rales,rhonchi  or crepitation. No use of accessory muscles of respiration.  CARDIOVASCULAR: S1, S2 normal. No murmurs, rubs, or gallops.  ABDOMEN: Soft, nontender, nondistended. Bowel sounds present. No organomegaly or mass.  EXTREMITIES: No pedal edema, cyanosis, or clubbing.  NEUROLOGIC: Cranial nerves II through XII are intact. 5/5 muscle strength in the RUE, LUE, and RLE. 4-5/5 muscle strength in the RLE. Sensation intact to light touch throughout. Normal finger-to-nose testing bilaterally. Reflexes symmetric. PSYCHIATRIC: The patient is alert and oriented x 3.  SKIN: No obvious rash, lesion, or ulcer.   LABORATORY PANEL:   CBC Recent Labs  Lab 06/05/18 1517  WBC 6.9  HGB 13.0  HCT 37.2*  PLT 156   ------------------------------------------------------------------------------------------------------------------  Chemistries  Recent Labs  Lab 06/05/18 1517  NA 141  K 4.1  CL 107  CO2 26  GLUCOSE 150*  BUN 23  CREATININE 1.03  CALCIUM 8.6*  AST 25  ALT 15  ALKPHOS 68  BILITOT 0.5   ------------------------------------------------------------------------------------------------------------------  Cardiac Enzymes Recent Labs  Lab 06/05/18 1517  TROPONINI <0.03   ------------------------------------------------------------------------------------------------------------------  RADIOLOGY:  Ct Head Wo Contrast  Result Date: 06/05/2018 CLINICAL DATA:  Left arm tingling and numbness EXAM: CT HEAD WITHOUT CONTRAST TECHNIQUE: Contiguous axial images were obtained from the base of the skull through the vertex without intravenous contrast. COMPARISON:  CT 04/15/2016 FINDINGS: Brain: No acute territorial infarction, or intracranial mass is visualized. Patient previously given intravenous contrast which limits evaluation for hemorrhage, no obvious acute hemorrhage is seen. Atrophy with small vessel ischemic changes of the white matter. Stable ventricle size Vascular: Increased density within  the venous sinuses and vascular structures consistent with recent contrast administration. Vertebral artery and carotid vascular calcification Skull: Normal. Negative for fracture or focal lesion. Sinuses/Orbits: No acute finding. Other: None IMPRESSION: 1. Slightly limited secondary to recent contrast administration. Allowing for this, no definite CT evidence for acute intracranial abnormality. 2. Atrophy with small vessel ischemic changes of the white matter. Electronically Signed   By: Donavan Foil M.D.   On: 06/05/2018 17:54   Dg Chest Portable 1 View  Result Date: 06/05/2018 CLINICAL DATA:  84 year old male with central chest pain today. EXAM: PORTABLE CHEST 1 VIEW COMPARISON:  Chest CT 04/15/2016.  Radiographs 11/22/2009. FINDINGS: Portable AP upright view at 1525 hours. Chronic tortuous and calcified thoracic aorta. Mild-to-moderate cardiomegaly appears increased since 2011. Other mediastinal contours are within normal limits. Visualized tracheal air column is within normal limits. Allowing for portable technique the lungs are clear. No acute osseous abnormality identified. Negative visible bowel gas pattern. IMPRESSION: 1. No acute cardiopulmonary abnormality. Cardiomegaly appears mildly increased since 2011. 2.  Aortic Atherosclerosis (ICD10-I70.0). Electronically Signed   By: Genevie Ann M.D.   On: 06/05/2018 15:40   Ct Angio Chest Aorta W And/or Wo Contrast  Result Date: 06/05/2018 CLINICAL DATA:  Chest pain of the left arm tingling. EXAM: CT ANGIOGRAPHY CHEST WITH CONTRAST TECHNIQUE: Multidetector CT imaging of the  chest was performed using the standard protocol during bolus administration of intravenous contrast. Multiplanar CT image reconstructions and MIPs were obtained to evaluate the vascular anatomy. CONTRAST:  91mL ISOVUE-370 IOPAMIDOL (ISOVUE-370) INJECTION 76% COMPARISON:  Chest CT 04/15/2016 FINDINGS: CTA CHEST FINDINGS Cardiovascular: --Heart: The heart size is moderately enlarged. There is  nopericardial effusion. Extensive coronary artery and valvular calcifications. --Aorta: The course and caliber of the thoracic aorta are normal. There is extensive aortic atherosclerotic calcification. Precontrast images show no aortic intramural hematoma. There is no blood pool, dissection or penetrating ulcer demonstrated on arterial phase postcontrast imaging. There is a conventional 3 vessel aortic arch branching pattern. The proximal arch vessels are widely patent. --Pulmonary Arteries: Contrast timing is optimized for preferential opacification of the aorta. Within that limitation, normal central pulmonary arteries. Mediastinum/Nodes: No mediastinal, hilar or axillary lymphadenopathy. Mild thoracic esophageal thickening. Lungs/Pleura: No pulmonary nodules or masses. No pleural effusion or pneumothorax. No focal airspace consolidation. No focal pleural abnormality. Musculoskeletal: No chest wall abnormality. No acute osseous findings. Upper abdomen: Diffuse gastric wall thickening may be due to underdistention. There is calcification of the proximal aortic branch vessels, which remain patent. Review of the MIP images confirms the above findings. IMPRESSION: 1. No acute aortic syndrome or other acute thoracic abnormality. 2. Gastric wall thickening may be due to underdistention. Gastritis or gastric lymphoproliferative disease could also cause this appearance. There is also mild thickening of the esophagus. Aortic Atherosclerosis (ICD10-I70.0). Electronically Signed   By: Ulyses Jarred M.D.   On: 06/05/2018 17:08    IMPRESSION AND PLAN:   Chest Pain- concern for ACS given that patient has a history of CAD and MI s/p stent in 1990. Chest pain also relieved by nitro. Follows with Dr. Mabeline Caras. Last stress test was in 2016 and was normal. Initial EKG without ST/T wave changes and troponin was negative. CTA chest negative for dissection. - Cardiology consulted - Trend troponins - Repeat EKG in the morning -  Continue home aspirin 81 and coreg bid - Cardiac monitoring  Left sided weakness- not acute, has been going on for 1 year. 5/5 strength in upper extremities, but does have mildly decreased strength in the RLE. CT head negative. - MRI brain pending - Continue aspirin 81mg  - Neuro consult if MRI shows a stroke  HFrEF- Last ECHO 05/2017 with EF 35% and moderate MR. Appears euvolemic on exam. - Continue home coreg, lisinopril, aspirin, and spironolactone  Hyperlipidemia- had lipid panel done this year - Continue home lipitor  All the records are reviewed and case discussed with ED provider. Management plans discussed with the patient, family and they are in agreement.  CODE STATUS: DNR  TOTAL TIME TAKING CARE OF THIS PATIENT: 40 minutes.    Berna Spare Mayo M.D on 06/05/2018 at 6:28 PM  Between 7am to 6pm - Pager [phone]  After 6pm go to [email or web address] - Proofreader  [facility]  Office  [phone]  CC: Primary care physician; Dion Body, MD   Note: This dictation was prepared with Dragon dictation along with smaller phrase technology. Any transcriptional errors that result from this process are unintentional.

## 2018-06-05 NOTE — ED Provider Notes (Signed)
[facility] Emergency Department Provider Note    First MD Initiated Contact with Patient 06/05/18 1516     (approximate)  I have reviewed the triage vital signs and the nursing notes.   HISTORY  Chief Complaint Chest Pain    HPI [name] is a 84 y.o. male remote history of MI status post plasty presents to the ER with midsternal chest pain that started around 2 PM after the patient was sleeping.  States the pain is still ongoing denies any nausea or diaphoresis with this.  Pain does radiate down his left arm and is causing some numbness and tingling.  Pain is not worsened by position or movement.  Is no reproducible palpation.  States he is unsure if this feels like his previous MI as it was so long ago.  Denies any abdominal pain.  No fevers.  No cough or shortness of breath.  He did take 324 aspirin as well as nitro spray in route with some improvement in symptoms.    Past Medical History:  Diagnosis Date  . MI, old    History reviewed. No pertinent family history. Past Surgical History:  Procedure Laterality Date  . CORONARY ANGIOPLASTY WITH STENT PLACEMENT    . hemmorroid    . ROTATOR CUFF REPAIR     Patient Active Problem List   Diagnosis Date Noted  . Chest pain 06/05/2018      Prior to Admission medications   Medication Sig Start Date End Date Taking? Authorizing Provider  aspirin 81 MG tablet Take 81 mg by mouth daily.   Yes [provider]  atorvastatin (LIPITOR) 20 MG tablet Take 20 mg by mouth daily.   Yes [provider]  carvedilol (COREG) 3.125 MG tablet Take 3.125 mg by mouth 2 (two) times daily with a meal.   Yes [provider]  lisinopril (PRINIVIL,ZESTRIL) 10 MG tablet Take 10 mg by mouth daily.   Yes [provider]  omeprazole (PRILOSEC) 20 MG capsule Take 20 mg by mouth daily.   Yes [provider]  spironolactone (ALDACTONE) 25 MG tablet Take 25 mg by mouth daily.   Yes  [provider]  acetaminophen (TYLENOL) 500 MG tablet Take 500 mg by mouth every 6 (six) hours as needed for mild pain.     [provider]    Allergies Patient has no known allergies.    Social History Social History   Tobacco Use  . Smoking status: Never Smoker  . Smokeless tobacco: Never Used  Substance Use Topics  . Alcohol use: No  . Drug use: Not on file    Review of Systems Patient denies headaches, rhinorrhea, blurry vision, numbness, shortness of breath, chest pain, edema, cough, abdominal pain, nausea, vomiting, diarrhea, dysuria, fevers, rashes or hallucinations unless otherwise stated above in HPI. ____________________________________________   PHYSICAL EXAM:  VITAL SIGNS: Vitals:   06/05/18 2000 06/05/18 2038  BP: 134/72 (!) 145/74  Pulse: (!) 59 61  Resp: 12 13  Temp:  97.8 F (36.6 C)  SpO2: 95% 99%    Constitutional: Alert and oriented.  Eyes: Conjunctivae are normal.  Head: Atraumatic. Nose: No congestion/rhinnorhea. Mouth/Throat: Mucous membranes are moist.   Neck: No stridor. Painless ROM.  Cardiovascular: Normal rate, regular rhythm. Grossly normal heart sounds.  Good peripheral circulation. Respiratory: Normal respiratory effort.  No retractions. Lungs CTAB. Gastrointestinal: Soft and nontender. No distention. No abdominal bruits. No CVA tenderness. Genitourinary:  Musculoskeletal: No lower extremity tenderness nor  edema.  No joint effusions. Neurologic:  Normal speech and language. No gross focal neurologic deficits are appreciated. No facial droop Skin:  Skin is warm, dry and intact. No rash noted. Psychiatric: Mood and affect are normal. Speech and behavior are normal.  ____________________________________________   LABS (all labs ordered are listed, but only abnormal results are displayed)  Results for orders placed or performed during the hospital encounter of 06/05/18 (from the past 24 hour(s))  CBC with  Differential/Platelet     Status: Abnormal   Collection Time: 06/05/18  3:17 PM  Result Value Ref Range   WBC 6.9 3.8 - 10.6 K/uL   RBC 3.92 (L) 4.40 - 5.90 MIL/uL   Hemoglobin 13.0 13.0 - 18.0 g/dL   HCT 37.2 (L) 40.0 - 52.0 %   MCV 94.9 80.0 - 100.0 fL   MCH 33.2 26.0 - 34.0 pg   MCHC 35.0 32.0 - 36.0 g/dL   RDW 14.3 11.5 - 14.5 %   Platelets 156 150 - 440 K/uL   Neutrophils Relative % 58 %   Neutro Abs 4.0 1.4 - 6.5 K/uL   Lymphocytes Relative 26 %   Lymphs Abs 1.8 1.0 - 3.6 K/uL   Monocytes Relative 10 %   Monocytes Absolute 0.7 0.2 - 1.0 K/uL   Eosinophils Relative 5 %   Eosinophils Absolute 0.4 0 - 0.7 K/uL   Basophils Relative 1 %   Basophils Absolute 0.1 0 - 0.1 K/uL  Comprehensive metabolic panel     Status: Abnormal   Collection Time: 06/05/18  3:17 PM  Result Value Ref Range   Sodium 141 135 - 145 mmol/L   Potassium 4.1 3.5 - 5.1 mmol/L   Chloride 107 98 - 111 mmol/L   CO2 26 22 - 32 mmol/L   Glucose, Bld 150 (H) 70 - 99 mg/dL   BUN 23 8 - 23 mg/dL   Creatinine, Ser 1.03 0.61 - 1.24 mg/dL   Calcium 8.6 (L) 8.9 - 10.3 mg/dL   Total Protein 6.5 6.5 - 8.1 g/dL   Albumin 3.9 3.5 - 5.0 g/dL   AST 25 15 - 41 U/L   ALT 15 0 - 44 U/L   Alkaline Phosphatase 68 38 - 126 U/L   Total Bilirubin 0.5 0.3 - 1.2 mg/dL   GFR calc non Af Amer >60 >60 mL/min   GFR calc Af Amer >60 >60 mL/min   Anion gap 8 5 - 15  Troponin I     Status: None   Collection Time: 06/05/18  3:17 PM  Result Value Ref Range   Troponin I <0.03 <0.03 ng/mL  Troponin I (q 6hr x 3)     Status: None   Collection Time: 06/05/18  8:40 PM  Result Value Ref Range   Troponin I <0.03 <0.03 ng/mL   ____________________________________________  EKG My review and personal interpretation at Time: 15:21   Indication: chest pain  Rate: 65  Rhythm: sinus Axis: normal Other: normal intervals, no stemi, incomplete RBBB ____________________________________________  RADIOLOGY  I personally reviewed all  radiographic images ordered to evaluate for the above acute complaints and reviewed radiology reports and findings.  These findings were personally discussed with the patient.  Please see medical record for radiology report.  ____________________________________________   PROCEDURES  Procedure(s) performed:  Procedures    Critical Care performed: no ____________________________________________   INITIAL IMPRESSION / ASSESSMENT AND PLAN / ED COURSE  Pertinent labs & imaging results that were available during my care of the  patient were reviewed by me and considered in my medical decision making (see chart for details).   DDX: ACS, pericarditis, esophagitis, boerhaaves, pe, dissection, pna, bronchitis, costochondritis   ELIJAHJAMES FUELLING is a 84 y.o. who presents to the ED with pain as described above with achiness rating into his left shoulder and arm.  EKG is unchanged from previous.  Patient does have a history of hiatal hernia.  No fevers or shortness of breath.  No change in position with pain.  Initial troponin is negative.  Will send for CT angiogram given his pain to evaluate for aortic dissection.  Clinical Course as of Jun 05 2317  Fri Jun 05, 2018  1721 Patient states that chest pain is resolved but still having some heaviness in his left arm.  Does not have any objective neuro deficit.  States that it feels tingly in the whole arm.  Will order CT imaging to evaluate for bleed.  Do not feel this meets criteria for TPA or code stroke given absence of objective deficits.   [PR]    Clinical Course User Index [PR] Merlyn Lot, MD   Show chronic microvascular changes.  Based on his age and risk factors do feel patient would benefit from medicine consultation possible admission the hospital for CVA work-up.  Will order MRI.  As part of my medical decision making, I reviewed the following data within the [place] notes reviewed and incorporated,  Labs reviewed, notes from prior ED visits   ____________________________________________   FINAL CLINICAL IMPRESSION(S) / ED DIAGNOSES  Final diagnoses:  Chest pain, unspecified type  Paresthesias      NEW MEDICATIONS STARTED DURING THIS VISIT:  Current Discharge Medication List       Note:  This document was prepared using Dragon voice recognition software and may include unintentional dictation errors.    Merlyn Lot, MD 06/05/18 2318

## 2018-06-05 NOTE — Progress Notes (Signed)
Family Meeting Note  Advance Directive:yes  Today a meeting took place with the Patient.  Patient is able to participate.  The following clinical team members were present during this meeting:MD  The following were discussed:Patient's diagnosis: , Patient's progosis: Unable to determine and Goals for treatment: DNR  Additional follow-up to be provided: prn  Time spent during discussion:20 minutes  Blue Winther D Melady Chow, MD  

## 2018-06-06 DIAGNOSIS — I5022 Chronic systolic (congestive) heart failure: Secondary | ICD-10-CM | POA: Diagnosis not present

## 2018-06-06 DIAGNOSIS — I25110 Atherosclerotic heart disease of native coronary artery with unstable angina pectoris: Secondary | ICD-10-CM | POA: Diagnosis not present

## 2018-06-06 DIAGNOSIS — I10 Essential (primary) hypertension: Secondary | ICD-10-CM | POA: Diagnosis not present

## 2018-06-06 DIAGNOSIS — R531 Weakness: Secondary | ICD-10-CM | POA: Diagnosis not present

## 2018-06-06 DIAGNOSIS — R079 Chest pain, unspecified: Secondary | ICD-10-CM | POA: Diagnosis not present

## 2018-06-06 LAB — TROPONIN I

## 2018-06-06 MED ORDER — NITROGLYCERIN 0.4 MG SL SUBL: 0.4000 mg | SUBLINGUAL_TABLET | SUBLINGUAL | 0 refills | Status: DC | PRN

## 2018-06-06 NOTE — Discharge Summary (Signed)
[facility] NAME: [name]    MR#:  [id number]  DATE OF BIRTH:  26-Jun-1927  DATE OF ADMISSION:  06/05/2018 ADMITTING PHYSICIAN: Sela Hua, MD  DATE OF DISCHARGE: 06/06/2018  PRIMARY CARE PHYSICIAN: Dion Body, MD    ADMISSION DIAGNOSIS:  Paresthesias [R20.2] Chest pain, unspecified type [R07.9]  DISCHARGE DIAGNOSIS:  Active Problems:   Chest pain   SECONDARY DIAGNOSIS:   Past Medical History:  Diagnosis Date  . MI, old     HOSPITAL COURSE:   84 year old male with a history of chronic systolic heart failure EF 35%, hyperlipidemia and CAD who presented to the emergency room with chest pain.  1.  Chest pain: Patient ruled out for ACS with negative troponins.  EKG showed no acute changes. Patient was evaluated by cardiology. Recommendations are continue outpatient medication management. Patient will follow-up with cardiology early next week.  2.  History of CAD status post stent: Patient will continue aspirin, atorvastatin, Coreg, lisinopril  3.  Chronic systolic heart failure ejection fraction 35%: Patient will continue on Aldactone Coreg and lisinopril.  Patient will follow-up with cardiology as an outpatient.  No signs of exacerbation at this time.  4.  Chronic left arm and leg numbness  DISCHARGE CONDITIONS AND DIET:   Stable for discharge on heart healthy diet  CONSULTS OBTAINED:  Treatment Team:  Corey Skains, MD  DRUG ALLERGIES:  No Known Allergies  DISCHARGE MEDICATIONS:   Allergies as of 06/06/2018   No Known Allergies     Medication List    TAKE these medications   acetaminophen 500 MG tablet Commonly known as:  TYLENOL Take 500 mg by mouth every 6 (six) hours as needed for mild pain.   aspirin 81 MG tablet Take 81 mg by mouth daily.   atorvastatin 20 MG tablet Commonly known as:  LIPITOR Take 20 mg by mouth daily.   carvedilol 3.125 MG tablet Commonly known as:   COREG Take 3.125 mg by mouth 2 (two) times daily with a meal.   lisinopril 10 MG tablet Commonly known as:  PRINIVIL,ZESTRIL Take 10 mg by mouth daily.   nitroGLYCERIN 0.4 MG SL tablet Commonly known as:  NITROSTAT Place 1 tablet (0.4 mg total) under the tongue every 5 (five) minutes as needed for chest pain.   omeprazole 20 MG capsule Commonly known as:  PRILOSEC Take 20 mg by mouth daily.   spironolactone 25 MG tablet Commonly known as:  ALDACTONE Take 25 mg by mouth daily.         Today   CHIEF COMPLAINT:   no chest pain  Had some numbness left  arm no other neurological deficits. He thinks it may be the IV in his left arm   VITAL SIGNS:  Blood pressure 108/76, pulse 60, temperature (!) 97.5 F (36.4 C), temperature source Oral, resp. rate 19, height 6' (1.829 m), weight 174 lb (78.9 kg), SpO2 96 %.   REVIEW OF SYSTEMS:  Review of Systems  Constitutional: Negative.  Negative for chills, fever and malaise/fatigue.  HENT: Negative.  Negative for ear discharge, ear pain, hearing loss, nosebleeds and sore throat.   Eyes: Negative.  Negative for blurred vision and pain.  Respiratory: Negative.  Negative for cough, hemoptysis, shortness of breath and wheezing.   Cardiovascular: Negative.  Negative for chest pain, palpitations and leg swelling.  Gastrointestinal: Negative.  Negative for abdominal pain, blood in stool, diarrhea, nausea and vomiting.  Genitourinary: Negative.  Negative for dysuria.  Musculoskeletal: Negative.  Negative for back pain.  Skin: Negative.   Neurological: Negative for dizziness, tremors, speech change, focal weakness, seizures and headaches.  Endo/Heme/Allergies: Negative.  Does not bruise/bleed easily.  Psychiatric/Behavioral: Negative.  Negative for depression, hallucinations and suicidal ideas.     PHYSICAL EXAMINATION:  GENERAL:  84 y.o.-year-old patient lying in the bed with no acute distress.  NECK:  Supple, no jugular venous  distention. No thyroid enlargement, no tenderness.  LUNGS: Normal breath sounds bilaterally, no wheezing, rales,rhonchi  No use of accessory muscles of respiration.  CARDIOVASCULAR: S1, S2 normal. No murmurs, rubs, or gallops.  ABDOMEN: Soft, non-tender, non-distended. Bowel sounds present. No organomegaly or mass.  EXTREMITIES: No pedal edema, cyanosis, or clubbing.  PSYCHIATRIC: The patient is alert and oriented x 3.  SKIN: No obvious rash, lesion, or ulcer.   DATA REVIEW:   CBC Recent Labs  Lab 06/05/18 1517  WBC 6.9  HGB 13.0  HCT 37.2*  PLT 156    Chemistries  Recent Labs  Lab 06/05/18 1517  NA 141  K 4.1  CL 107  CO2 26  GLUCOSE 150*  BUN 23  CREATININE 1.03  CALCIUM 8.6*  AST 25  ALT 15  ALKPHOS 68  BILITOT 0.5    Cardiac Enzymes Recent Labs  Lab 06/05/18 1517 06/05/18 2040 06/06/18 0207  TROPONINI <0.03 <0.03 <0.03    Microbiology Results  @[email or web address] @  RADIOLOGY:  Ct Head Wo Contrast  Result Date: 06/05/2018 CLINICAL DATA:  Left arm tingling and numbness EXAM: CT HEAD WITHOUT CONTRAST TECHNIQUE: Contiguous axial images were obtained from the base of the skull through the vertex without intravenous contrast. COMPARISON:  CT 04/15/2016 FINDINGS: Brain: No acute territorial infarction, or intracranial mass is visualized. Patient previously given intravenous contrast which limits evaluation for hemorrhage, no obvious acute hemorrhage is seen. Atrophy with small vessel ischemic changes of the white matter. Stable ventricle size Vascular: Increased density within the venous sinuses and vascular structures consistent with recent contrast administration. Vertebral artery and carotid vascular calcification Skull: Normal. Negative for fracture or focal lesion. Sinuses/Orbits: No acute finding. Other: None IMPRESSION: 1. Slightly limited secondary to recent contrast administration. Allowing for this, no definite CT evidence for acute intracranial abnormality. 2.  Atrophy with small vessel ischemic changes of the white matter. Electronically Signed   By: Donavan Foil M.D.   On: 06/05/2018 17:54   Mr Brain Wo Contrast  Result Date: 06/05/2018 CLINICAL DATA:  Focal neuro deficit, > 6 hrs, stroke suspected EXAM: MRI HEAD WITHOUT CONTRAST TECHNIQUE: Multiplanar, multiecho pulse sequences of the brain and surrounding structures were obtained without intravenous contrast. COMPARISON:  Head CT 06/05/2018 FINDINGS: BRAIN: There is no acute infarct, acute hemorrhage or mass effect. The midline structures are normal. There are no old infarcts. Diffuse confluent hyperintense T2-weighted signal within the periventricular, deep and juxtacortical white matter, most commonly due to chronic ischemic microangiopathy. Advanced generalized atrophy without lobar predilection. Susceptibility-sensitive sequences show no chronic microhemorrhage or superficial siderosis. VASCULAR: Major intracranial arterial and venous sinus flow voids are preserved. SKULL AND UPPER CERVICAL SPINE: The visualized skull base, calvarium, upper cervical spine and extracranial soft tissues are normal. SINUSES/ORBITS: Moderate right and mild left maxillary sinus mucosal thickening. The orbits are normal. IMPRESSION: 1. No acute abnormality. 2. Advanced generalized atrophy and chronic ischemic microangiopathy. Electronically Signed   By: Ulyses Jarred M.D.   On: 06/05/2018 22:54   Dg Chest Portable 1 View  Result Date: 06/05/2018 CLINICAL DATA:  84 year old male with central chest pain today. EXAM: PORTABLE CHEST 1 VIEW COMPARISON:  Chest CT 04/15/2016.  Radiographs 11/22/2009. FINDINGS: Portable AP upright view at 1525 hours. Chronic tortuous and calcified thoracic aorta. Mild-to-moderate cardiomegaly appears increased since 2011. Other mediastinal contours are within normal limits. Visualized tracheal air column is within normal limits. Allowing for portable technique the lungs are clear. No acute osseous  abnormality identified. Negative visible bowel gas pattern. IMPRESSION: 1. No acute cardiopulmonary abnormality. Cardiomegaly appears mildly increased since 2011. 2.  Aortic Atherosclerosis (ICD10-I70.0). Electronically Signed   By: Genevie Ann M.D.   On: 06/05/2018 15:40   Ct Angio Chest Aorta W And/or Wo Contrast  Result Date: 06/05/2018 CLINICAL DATA:  Chest pain of the left arm tingling. EXAM: CT ANGIOGRAPHY CHEST WITH CONTRAST TECHNIQUE: Multidetector CT imaging of the chest was performed using the standard protocol during bolus administration of intravenous contrast. Multiplanar CT image reconstructions and MIPs were obtained to evaluate the vascular anatomy. CONTRAST:  35mL ISOVUE-370 IOPAMIDOL (ISOVUE-370) INJECTION 76% COMPARISON:  Chest CT 04/15/2016 FINDINGS: CTA CHEST FINDINGS Cardiovascular: --Heart: The heart size is moderately enlarged. There is nopericardial effusion. Extensive coronary artery and valvular calcifications. --Aorta: The course and caliber of the thoracic aorta are normal. There is extensive aortic atherosclerotic calcification. Precontrast images show no aortic intramural hematoma. There is no blood pool, dissection or penetrating ulcer demonstrated on arterial phase postcontrast imaging. There is a conventional 3 vessel aortic arch branching pattern. The proximal arch vessels are widely patent. --Pulmonary Arteries: Contrast timing is optimized for preferential opacification of the aorta. Within that limitation, normal central pulmonary arteries. Mediastinum/Nodes: No mediastinal, hilar or axillary lymphadenopathy. Mild thoracic esophageal thickening. Lungs/Pleura: No pulmonary nodules or masses. No pleural effusion or pneumothorax. No focal airspace consolidation. No focal pleural abnormality. Musculoskeletal: No chest wall abnormality. No acute osseous findings. Upper abdomen: Diffuse gastric wall thickening may be due to underdistention. There is calcification of the proximal aortic  branch vessels, which remain patent. Review of the MIP images confirms the above findings. IMPRESSION: 1. No acute aortic syndrome or other acute thoracic abnormality. 2. Gastric wall thickening may be due to underdistention. Gastritis or gastric lymphoproliferative disease could also cause this appearance. There is also mild thickening of the esophagus. Aortic Atherosclerosis (ICD10-I70.0). Electronically Signed   By: Ulyses Jarred M.D.   On: 06/05/2018 17:08      Allergies as of 06/06/2018   No Known Allergies     Medication List    TAKE these medications   acetaminophen 500 MG tablet Commonly known as:  TYLENOL Take 500 mg by mouth every 6 (six) hours as needed for mild pain.   aspirin 81 MG tablet Take 81 mg by mouth daily.   atorvastatin 20 MG tablet Commonly known as:  LIPITOR Take 20 mg by mouth daily.   carvedilol 3.125 MG tablet Commonly known as:  COREG Take 3.125 mg by mouth 2 (two) times daily with a meal.   lisinopril 10 MG tablet Commonly known as:  PRINIVIL,ZESTRIL Take 10 mg by mouth daily.   nitroGLYCERIN 0.4 MG SL tablet Commonly known as:  NITROSTAT Place 1 tablet (0.4 mg total) under the tongue every 5 (five) minutes as needed for chest pain.   omeprazole 20 MG capsule Commonly known as:  PRILOSEC Take 20 mg by mouth daily.   spironolactone 25 MG tablet Commonly known as:  ALDACTONE Take 25 mg by mouth daily.           Management plans  discussed with the patient and he is in agreement. Stable for discharge home  Patient should follow up with cardiology  CODE STATUS:     Code Status Orders  (From admission, onward)        Start     Ordered   06/05/18 2016  Do not attempt resuscitation (DNR)  Continuous    Question Answer Comment  In the event of cardiac or respiratory ARREST Do not call a "code blue"   In the event of cardiac or respiratory ARREST Do not perform Intubation, CPR, defibrillation or ACLS   In the event of cardiac or  respiratory ARREST Use medication by any route, position, wound care, and other measures to relive pain and suffering. May use oxygen, suction and manual treatment of airway obstruction as needed for comfort.      06/05/18 2015    Code Status History    This patient has a current code status but no historical code status.    Advance Directive Documentation     Most Recent Value  Type of Advance Directive  Out of facility DNR (pink MOST or yellow form)  Pre-existing out of facility DNR order (yellow form or pink MOST form)  -  "MOST" Form in Place?  -      TOTAL TIME TAKING CARE OF THIS PATIENT: 38 minutes.    Note: This dictation was prepared with Dragon dictation along with smaller phrase technology. Any transcriptional errors that result from this process are unintentional.  Daviana Haymaker M.D on 06/06/2018 at 10:27 AM  Between 7am to 6pm - Pager - [phone] After 6pm go to [email or web address] - password [email or web address] [facility]  Office  [phone]  CC: Primary care physician; Dion Body, MD

## 2018-06-06 NOTE — Consult Note (Signed)
[facility] Consultation Note  Patient ID: [name], MRN: [id number], DOB/AGE: 82-Aug-1928 82 y.o. Admit date: 06/05/2018   Date of Consult: 06/06/2018 Primary Physician: Dion Body, MD Primary Cardiologist: Nehemiah Massed  Chief Complaint:  Chief Complaint  Patient presents with  . Chest Pain   Reason for Consult: Chest pain  HPI: 84 y.o. male with known coronary artery disease status post previous stenting essential hypertension mixed hyperlipidemia and moderate dilated cardiomyopathy on appropriate medication management including ACE inhibitor beta-blocker aspirin spironolactone and atorvastatin.  The patient has done fairly well in many months but recently has claimed that his left arm feels a little bit heavier over the last several weeks.  He was doing his normal routine when he woke up from a nap with some left upper chest discomfort.  This chest discomfort lasted for approximately 1 hour and was relieved spontaneously as well as with some nitroglycerin.  His left arm still feels heavy and is constantly that way.  It is unclear as to the issue.  After admission the patient did have continued resolution of chest discomfort but some mild heaviness in the left arm still.  This is without physical activity.  Troponin levels are normal consistent without evidence of myocardial infarction and there is no evidence of congestive heart failure symptoms at this time.  The patient does have an EKG showing normal sinus rhythm with left axis deviation and right bundle branch block unchanged from before.  Currently the patient feels well on appropriate outpatient medication management and stable  Past Medical History:  Diagnosis Date  . MI, old       Surgical History:  Past Surgical History:  Procedure Laterality Date  . CORONARY ANGIOPLASTY WITH STENT PLACEMENT    . hemmorroid    . ROTATOR CUFF REPAIR       Home Meds: Prior to Admission medications   Medication Sig Start  Date End Date Taking? Authorizing Provider  aspirin 81 MG tablet Take 81 mg by mouth daily.   Yes [provider]  atorvastatin (LIPITOR) 20 MG tablet Take 20 mg by mouth daily.   Yes [provider]  carvedilol (COREG) 3.125 MG tablet Take 3.125 mg by mouth 2 (two) times daily with a meal.   Yes [provider]  lisinopril (PRINIVIL,ZESTRIL) 10 MG tablet Take 10 mg by mouth daily.   Yes [provider]  omeprazole (PRILOSEC) 20 MG capsule Take 20 mg by mouth daily.   Yes [provider]  spironolactone (ALDACTONE) 25 MG tablet Take 25 mg by mouth daily.   Yes [provider]  acetaminophen (TYLENOL) 500 MG tablet Take 500 mg by mouth every 6 (six) hours as needed for mild pain.     [provider]    Inpatient Medications:  . aspirin EC  81 mg Oral Daily  . atorvastatin  20 mg Oral Daily  . carvedilol  3.125 mg Oral BID WC  . enoxaparin (LOVENOX) injection  40 mg Subcutaneous Q24H  . lisinopril  10 mg Oral Daily  . pantoprazole  40 mg Oral Daily  . spironolactone  25 mg Oral Daily     Allergies: No Known Allergies  Social History   Socioeconomic History  . Marital status: Married    Spouse name: Not on file  . Number of children: Not on file  . Years of education: Not on file  . Highest education level: Not on file  Occupational History  . Not on file  Social Needs  . Financial resource strain: Not on file  . Food insecurity:    Worry: Not on file    Inability: Not on file  . Transportation needs:    Medical: Not on file    Non-medical: Not on file  Tobacco Use  . Smoking status: Never Smoker  . Smokeless tobacco: Never Used  Substance and Sexual Activity  . Alcohol use: No  . Drug use: Not on file  . Sexual activity: Not on file  Lifestyle  . Physical activity:    Days per week: Not on file    Minutes per session: Not on file  . Stress: Not on file  Relationships  . Social connections:    Talks on  phone: Not on file    Gets together: Not on file    Attends religious service: Not on file    Active member of club or organization: Not on file    Attends meetings of clubs or organizations: Not on file    Relationship status: Not on file  . Intimate partner violence:    Fear of current or ex partner: Not on file    Emotionally abused: Not on file    Physically abused: Not on file    Forced sexual activity: Not on file  Other Topics Concern  . Not on file  Social History Narrative  . Not on file     History reviewed. No pertinent family history.   Review of Systems Positive for chest pain and left arm pain Negative for: General:  chills, fever, night sweats or weight changes.  Cardiovascular: PND orthopnea syncope dizziness  Dermatological skin lesions rashes Respiratory: Cough congestion Urologic: Frequent urination urination at night and hematuria Abdominal: negative for nausea, vomiting, diarrhea, bright red blood per rectum, melena, or hematemesis Neurologic: negative for visual changes, and/or hearing changes  All other systems reviewed and are otherwise negative except as noted above.  Labs: Recent Labs    06/05/18 1517 06/05/18 2040 06/06/18 0207  TROPONINI <0.03 <0.03 <0.03   Lab Results  Component Value Date   WBC 6.9 06/05/2018   HGB 13.0 06/05/2018   HCT 37.2 (L) 06/05/2018   MCV 94.9 06/05/2018   PLT 156 06/05/2018    Recent Labs  Lab 06/05/18 1517  NA 141  K 4.1  CL 107  CO2 26  BUN 23  CREATININE 1.03  CALCIUM 8.6*  PROT 6.5  BILITOT 0.5  ALKPHOS 68  ALT 15  AST 25  GLUCOSE 150*   No results found for: CHOL, HDL, LDLCALC, TRIG No results found for: DDIMER  Radiology/Studies:  Ct Head Wo Contrast  Result Date: 06/05/2018 CLINICAL DATA:  Left arm tingling and numbness EXAM: CT HEAD WITHOUT CONTRAST TECHNIQUE: Contiguous axial images were obtained from the base of the skull through the vertex without intravenous contrast. COMPARISON:   CT 04/15/2016 FINDINGS: Brain: No acute territorial infarction, or intracranial mass is visualized. Patient previously given intravenous contrast which limits evaluation for hemorrhage, no obvious acute hemorrhage is seen. Atrophy with small vessel ischemic changes of the white matter. Stable ventricle size Vascular: Increased density within the venous sinuses and vascular structures consistent with recent contrast administration. Vertebral artery and carotid vascular calcification Skull: Normal. Negative for fracture or focal lesion. Sinuses/Orbits: No acute finding. Other: None IMPRESSION: 1. Slightly limited secondary to recent contrast administration. Allowing for this, no definite CT evidence for acute intracranial abnormality. 2. Atrophy with small vessel ischemic changes of the white matter. Electronically  Signed   By: Donavan Foil M.D.   On: 06/05/2018 17:54   Mr Brain Wo Contrast  Result Date: 06/05/2018 CLINICAL DATA:  Focal neuro deficit, > 6 hrs, stroke suspected EXAM: MRI HEAD WITHOUT CONTRAST TECHNIQUE: Multiplanar, multiecho pulse sequences of the brain and surrounding structures were obtained without intravenous contrast. COMPARISON:  Head CT 06/05/2018 FINDINGS: BRAIN: There is no acute infarct, acute hemorrhage or mass effect. The midline structures are normal. There are no old infarcts. Diffuse confluent hyperintense T2-weighted signal within the periventricular, deep and juxtacortical white matter, most commonly due to chronic ischemic microangiopathy. Advanced generalized atrophy without lobar predilection. Susceptibility-sensitive sequences show no chronic microhemorrhage or superficial siderosis. VASCULAR: Major intracranial arterial and venous sinus flow voids are preserved. SKULL AND UPPER CERVICAL SPINE: The visualized skull base, calvarium, upper cervical spine and extracranial soft tissues are normal. SINUSES/ORBITS: Moderate right and mild left maxillary sinus mucosal thickening. The  orbits are normal. IMPRESSION: 1. No acute abnormality. 2. Advanced generalized atrophy and chronic ischemic microangiopathy. Electronically Signed   By: Ulyses Jarred M.D.   On: 06/05/2018 22:54   Dg Chest Portable 1 View  Result Date: 06/05/2018 CLINICAL DATA:  84 year old male with central chest pain today. EXAM: PORTABLE CHEST 1 VIEW COMPARISON:  Chest CT 04/15/2016.  Radiographs 11/22/2009. FINDINGS: Portable AP upright view at 1525 hours. Chronic tortuous and calcified thoracic aorta. Mild-to-moderate cardiomegaly appears increased since 2011. Other mediastinal contours are within normal limits. Visualized tracheal air column is within normal limits. Allowing for portable technique the lungs are clear. No acute osseous abnormality identified. Negative visible bowel gas pattern. IMPRESSION: 1. No acute cardiopulmonary abnormality. Cardiomegaly appears mildly increased since 2011. 2.  Aortic Atherosclerosis (ICD10-I70.0). Electronically Signed   By: Genevie Ann M.D.   On: 06/05/2018 15:40   Ct Angio Chest Aorta W And/or Wo Contrast  Result Date: 06/05/2018 CLINICAL DATA:  Chest pain of the left arm tingling. EXAM: CT ANGIOGRAPHY CHEST WITH CONTRAST TECHNIQUE: Multidetector CT imaging of the chest was performed using the standard protocol during bolus administration of intravenous contrast. Multiplanar CT image reconstructions and MIPs were obtained to evaluate the vascular anatomy. CONTRAST:  60mL ISOVUE-370 IOPAMIDOL (ISOVUE-370) INJECTION 76% COMPARISON:  Chest CT 04/15/2016 FINDINGS: CTA CHEST FINDINGS Cardiovascular: --Heart: The heart size is moderately enlarged. There is nopericardial effusion. Extensive coronary artery and valvular calcifications. --Aorta: The course and caliber of the thoracic aorta are normal. There is extensive aortic atherosclerotic calcification. Precontrast images show no aortic intramural hematoma. There is no blood pool, dissection or penetrating ulcer demonstrated on arterial  phase postcontrast imaging. There is a conventional 3 vessel aortic arch branching pattern. The proximal arch vessels are widely patent. --Pulmonary Arteries: Contrast timing is optimized for preferential opacification of the aorta. Within that limitation, normal central pulmonary arteries. Mediastinum/Nodes: No mediastinal, hilar or axillary lymphadenopathy. Mild thoracic esophageal thickening. Lungs/Pleura: No pulmonary nodules or masses. No pleural effusion or pneumothorax. No focal airspace consolidation. No focal pleural abnormality. Musculoskeletal: No chest wall abnormality. No acute osseous findings. Upper abdomen: Diffuse gastric wall thickening may be due to underdistention. There is calcification of the proximal aortic branch vessels, which remain patent. Review of the MIP images confirms the above findings. IMPRESSION: 1. No acute aortic syndrome or other acute thoracic abnormality. 2. Gastric wall thickening may be due to underdistention. Gastritis or gastric lymphoproliferative disease could also cause this appearance. There is also mild thickening of the esophagus. Aortic Atherosclerosis (ICD10-I70.0). Electronically Signed   By: Ulyses Jarred  M.D.   On: 06/05/2018 17:08    EKG: Normal sinus rhythm with left axis deviation and right bundle branch block  Weights: Filed Weights   06/05/18 1527  Weight: 174 lb (78.9 kg)     Physical Exam: Blood pressure 122/78, pulse 67, temperature 97.6 F (36.4 C), temperature source Oral, resp. rate 18, height 6' (1.829 m), weight 174 lb (78.9 kg), SpO2 96 %. Body mass index is 23.6 kg/m. General: Well developed, well nourished, in no acute distress. Head eyes ears nose throat: Normocephalic, atraumatic, sclera non-icteric, no xanthomas, nares are without discharge. No apparent thyromegaly and/or mass  Lungs: Normal respiratory effort.  no wheezes, no rales, no rhonchi.  Heart: RRR with normal S1 S2.  2+ apical murmur gallop, no rub, PMI is normal  size and placement, carotid upstroke normal without bruit, jugular venous pressure is normal Abdomen: Soft, non-tender, non-distended with normoactive bowel sounds. No hepatomegaly. No rebound/guarding. No obvious abdominal masses. Abdominal aorta is normal size without bruit Extremities: Trace edema. no cyanosis, no clubbing, no ulcers  Peripheral : 2+ bilateral upper extremity pulses, 2+ bilateral femoral pulses, 2+ bilateral dorsal pedal pulse Neuro: Alert and oriented. No facial asymmetry. No focal deficit. Moves all extremities spontaneously. Musculoskeletal: Normal muscle tone without kyphosis Psych:  Responds to questions appropriately with a normal affect.    Assessment: 84 year old male with known moderate dilated cardiomyopathy essential hypertension mixed hyperlipidemia coronary artery disease status post stenting having chest discomfort and atypical arm discomfort without evidence of myocardial infarction or new EKG changes  Plan: 1.  Continue outpatient medication management for cardiomyopathy hypertension hyperlipidemia and coronary artery disease as before 2.  Nitrates and/or isosorbide 30 mg each day for chest discomfort 3.  Begin ambulation and follow for improvements of symptoms or exacerbation and treatment thereof as necessary 4.  Further consideration of discharge to home if patient feeling relatively well and no further evidence of significant symptoms from the cardiac standpoint with outpatient follow-up with stress test and or other adjustments of medication management 5.  Possible consideration of further intervention and observation if patient has exacerbation of chest discomfort with ambulation  Signed, Corey Skains M.D. [facility] 06/06/2018, 6:42 AM

## 2018-06-06 NOTE — Progress Notes (Signed)
Discharged to home with his wife and son. Appointments and med changes explained to both patient and wife.  Wife acknowledged she understood.  Patient has short term memory loss.

## 2018-06-06 NOTE — Progress Notes (Signed)
PT Cancellation Note  Patient Details Name: [name] MRN: [id number] DOB: 1927-05-06   Cancelled Treatment:    Reason Eval/Treat Not Completed: Other (comment)(Nurse reports patient is dressed and leaving ) Patient's nurse reports patient is dressed and ready to leave for discharge.  Janna Arch, PT, DPT   06/06/2018, 11:39 AM

## 2018-06-06 NOTE — Progress Notes (Signed)
PT Cancellation Note  Patient Details Name: [name] MRN: [id number] DOB: 07-07-1927   Cancelled Treatment:    Reason Eval/Treat Not Completed: Medical issues which prohibited therapy(Multiple PVC's)  Will return back to patient at later date/time to assess mobility when PVC's are reduced.    Janna Arch, PT, DPT   06/06/2018, 8:38 AM

## 2018-06-08 DIAGNOSIS — I071 Rheumatic tricuspid insufficiency: Secondary | ICD-10-CM | POA: Diagnosis not present

## 2018-06-08 DIAGNOSIS — E7800 Pure hypercholesterolemia, unspecified: Secondary | ICD-10-CM | POA: Diagnosis not present

## 2018-06-08 DIAGNOSIS — I2510 Atherosclerotic heart disease of native coronary artery without angina pectoris: Secondary | ICD-10-CM | POA: Diagnosis not present

## 2018-06-08 DIAGNOSIS — I5022 Chronic systolic (congestive) heart failure: Secondary | ICD-10-CM | POA: Diagnosis not present

## 2018-06-08 DIAGNOSIS — I10 Essential (primary) hypertension: Secondary | ICD-10-CM | POA: Diagnosis not present

## 2018-06-08 DIAGNOSIS — R001 Bradycardia, unspecified: Secondary | ICD-10-CM | POA: Diagnosis not present

## 2018-06-08 DIAGNOSIS — R7303 Prediabetes: Secondary | ICD-10-CM | POA: Diagnosis not present

## 2018-06-08 DIAGNOSIS — I340 Nonrheumatic mitral (valve) insufficiency: Secondary | ICD-10-CM | POA: Diagnosis not present

## 2018-06-09 DIAGNOSIS — H353211 Exudative age-related macular degeneration, right eye, with active choroidal neovascularization: Secondary | ICD-10-CM | POA: Diagnosis not present

## 2018-06-12 DIAGNOSIS — I2510 Atherosclerotic heart disease of native coronary artery without angina pectoris: Secondary | ICD-10-CM | POA: Diagnosis not present

## 2018-06-16 DIAGNOSIS — I071 Rheumatic tricuspid insufficiency: Secondary | ICD-10-CM | POA: Diagnosis not present

## 2018-06-16 DIAGNOSIS — I5022 Chronic systolic (congestive) heart failure: Secondary | ICD-10-CM | POA: Diagnosis not present

## 2018-06-16 DIAGNOSIS — I471 Supraventricular tachycardia: Secondary | ICD-10-CM | POA: Diagnosis not present

## 2018-06-16 DIAGNOSIS — E7800 Pure hypercholesterolemia, unspecified: Secondary | ICD-10-CM | POA: Diagnosis not present

## 2018-06-16 DIAGNOSIS — I340 Nonrheumatic mitral (valve) insufficiency: Secondary | ICD-10-CM | POA: Diagnosis not present

## 2018-06-16 DIAGNOSIS — I10 Essential (primary) hypertension: Secondary | ICD-10-CM | POA: Diagnosis not present

## 2018-06-16 DIAGNOSIS — R079 Chest pain, unspecified: Secondary | ICD-10-CM | POA: Diagnosis not present

## 2018-06-16 DIAGNOSIS — I2510 Atherosclerotic heart disease of native coronary artery without angina pectoris: Secondary | ICD-10-CM | POA: Diagnosis not present

## 2018-06-16 DIAGNOSIS — R7303 Prediabetes: Secondary | ICD-10-CM | POA: Diagnosis not present

## 2018-06-24 DIAGNOSIS — R079 Chest pain, unspecified: Secondary | ICD-10-CM | POA: Diagnosis not present

## 2018-07-07 DIAGNOSIS — I10 Essential (primary) hypertension: Secondary | ICD-10-CM | POA: Diagnosis not present

## 2018-07-07 DIAGNOSIS — R0789 Other chest pain: Secondary | ICD-10-CM | POA: Diagnosis not present

## 2018-07-07 DIAGNOSIS — I5022 Chronic systolic (congestive) heart failure: Secondary | ICD-10-CM | POA: Diagnosis not present

## 2018-07-07 DIAGNOSIS — E7800 Pure hypercholesterolemia, unspecified: Secondary | ICD-10-CM | POA: Diagnosis not present

## 2018-07-07 DIAGNOSIS — I2510 Atherosclerotic heart disease of native coronary artery without angina pectoris: Secondary | ICD-10-CM | POA: Diagnosis not present

## 2018-08-04 DIAGNOSIS — H353211 Exudative age-related macular degeneration, right eye, with active choroidal neovascularization: Secondary | ICD-10-CM | POA: Diagnosis not present

## 2018-08-04 DIAGNOSIS — H353122 Nonexudative age-related macular degeneration, left eye, intermediate dry stage: Secondary | ICD-10-CM | POA: Diagnosis not present

## 2018-08-12 DIAGNOSIS — Z23 Encounter for immunization: Secondary | ICD-10-CM | POA: Diagnosis not present

## 2018-08-24 DIAGNOSIS — L821 Other seborrheic keratosis: Secondary | ICD-10-CM | POA: Diagnosis not present

## 2018-08-24 DIAGNOSIS — X32XXXA Exposure to sunlight, initial encounter: Secondary | ICD-10-CM | POA: Diagnosis not present

## 2018-08-24 DIAGNOSIS — C44319 Basal cell carcinoma of skin of other parts of face: Secondary | ICD-10-CM | POA: Diagnosis not present

## 2018-08-24 DIAGNOSIS — D485 Neoplasm of uncertain behavior of skin: Secondary | ICD-10-CM | POA: Diagnosis not present

## 2018-08-24 DIAGNOSIS — L570 Actinic keratosis: Secondary | ICD-10-CM | POA: Diagnosis not present

## 2018-09-17 DIAGNOSIS — Z862 Personal history of diseases of the blood and blood-forming organs and certain disorders involving the immune mechanism: Secondary | ICD-10-CM | POA: Diagnosis not present

## 2018-09-17 DIAGNOSIS — R7303 Prediabetes: Secondary | ICD-10-CM | POA: Diagnosis not present

## 2018-09-17 DIAGNOSIS — I10 Essential (primary) hypertension: Secondary | ICD-10-CM | POA: Diagnosis not present

## 2018-09-17 DIAGNOSIS — E7800 Pure hypercholesterolemia, unspecified: Secondary | ICD-10-CM | POA: Diagnosis not present

## 2018-09-18 DIAGNOSIS — C44319 Basal cell carcinoma of skin of other parts of face: Secondary | ICD-10-CM | POA: Diagnosis not present

## 2018-09-21 DIAGNOSIS — E7800 Pure hypercholesterolemia, unspecified: Secondary | ICD-10-CM | POA: Diagnosis not present

## 2018-09-21 DIAGNOSIS — I10 Essential (primary) hypertension: Secondary | ICD-10-CM | POA: Diagnosis not present

## 2018-09-21 DIAGNOSIS — Z862 Personal history of diseases of the blood and blood-forming organs and certain disorders involving the immune mechanism: Secondary | ICD-10-CM | POA: Diagnosis not present

## 2018-09-21 DIAGNOSIS — Z0000 Encounter for general adult medical examination without abnormal findings: Secondary | ICD-10-CM | POA: Diagnosis not present

## 2018-11-17 DIAGNOSIS — I10 Essential (primary) hypertension: Secondary | ICD-10-CM | POA: Diagnosis not present

## 2018-11-17 DIAGNOSIS — I5023 Acute on chronic systolic (congestive) heart failure: Secondary | ICD-10-CM | POA: Diagnosis not present

## 2018-11-17 DIAGNOSIS — I25118 Atherosclerotic heart disease of native coronary artery with other forms of angina pectoris: Secondary | ICD-10-CM | POA: Diagnosis not present

## 2018-12-08 DIAGNOSIS — I25118 Atherosclerotic heart disease of native coronary artery with other forms of angina pectoris: Secondary | ICD-10-CM | POA: Diagnosis not present

## 2018-12-15 DIAGNOSIS — H33311 Horseshoe tear of retina without detachment, right eye: Secondary | ICD-10-CM | POA: Diagnosis not present

## 2018-12-15 DIAGNOSIS — H353211 Exudative age-related macular degeneration, right eye, with active choroidal neovascularization: Secondary | ICD-10-CM | POA: Diagnosis not present

## 2018-12-15 DIAGNOSIS — H353122 Nonexudative age-related macular degeneration, left eye, intermediate dry stage: Secondary | ICD-10-CM | POA: Diagnosis not present

## 2018-12-18 DIAGNOSIS — I5022 Chronic systolic (congestive) heart failure: Secondary | ICD-10-CM | POA: Diagnosis not present

## 2018-12-18 DIAGNOSIS — I340 Nonrheumatic mitral (valve) insufficiency: Secondary | ICD-10-CM | POA: Diagnosis not present

## 2018-12-18 DIAGNOSIS — I10 Essential (primary) hypertension: Secondary | ICD-10-CM | POA: Diagnosis not present

## 2018-12-18 DIAGNOSIS — I2510 Atherosclerotic heart disease of native coronary artery without angina pectoris: Secondary | ICD-10-CM | POA: Diagnosis not present

## 2018-12-22 DIAGNOSIS — Z85828 Personal history of other malignant neoplasm of skin: Secondary | ICD-10-CM | POA: Diagnosis not present

## 2018-12-22 DIAGNOSIS — D485 Neoplasm of uncertain behavior of skin: Secondary | ICD-10-CM | POA: Diagnosis not present

## 2018-12-22 DIAGNOSIS — L821 Other seborrheic keratosis: Secondary | ICD-10-CM | POA: Diagnosis not present

## 2018-12-22 DIAGNOSIS — X32XXXA Exposure to sunlight, initial encounter: Secondary | ICD-10-CM | POA: Diagnosis not present

## 2018-12-22 DIAGNOSIS — D1801 Hemangioma of skin and subcutaneous tissue: Secondary | ICD-10-CM | POA: Diagnosis not present

## 2018-12-22 DIAGNOSIS — Z08 Encounter for follow-up examination after completed treatment for malignant neoplasm: Secondary | ICD-10-CM | POA: Diagnosis not present

## 2018-12-22 DIAGNOSIS — C4441 Basal cell carcinoma of skin of scalp and neck: Secondary | ICD-10-CM | POA: Diagnosis not present

## 2018-12-22 DIAGNOSIS — L570 Actinic keratosis: Secondary | ICD-10-CM | POA: Diagnosis not present

## 2018-12-31 DIAGNOSIS — C4491 Basal cell carcinoma of skin, unspecified: Secondary | ICD-10-CM | POA: Diagnosis not present

## 2019-01-05 DIAGNOSIS — G5701 Lesion of sciatic nerve, right lower limb: Secondary | ICD-10-CM | POA: Diagnosis not present

## 2019-01-13 DIAGNOSIS — G5702 Lesion of sciatic nerve, left lower limb: Secondary | ICD-10-CM | POA: Diagnosis not present

## 2019-01-13 DIAGNOSIS — M25552 Pain in left hip: Secondary | ICD-10-CM | POA: Diagnosis not present

## 2019-01-19 DIAGNOSIS — M25552 Pain in left hip: Secondary | ICD-10-CM | POA: Diagnosis not present

## 2019-01-21 DIAGNOSIS — M25552 Pain in left hip: Secondary | ICD-10-CM | POA: Diagnosis not present

## 2019-01-28 DIAGNOSIS — M25552 Pain in left hip: Secondary | ICD-10-CM | POA: Diagnosis not present

## 2019-03-16 DIAGNOSIS — I10 Essential (primary) hypertension: Secondary | ICD-10-CM | POA: Diagnosis not present

## 2019-03-16 DIAGNOSIS — E7800 Pure hypercholesterolemia, unspecified: Secondary | ICD-10-CM | POA: Diagnosis not present

## 2019-03-16 DIAGNOSIS — Z862 Personal history of diseases of the blood and blood-forming organs and certain disorders involving the immune mechanism: Secondary | ICD-10-CM | POA: Diagnosis not present

## 2019-03-23 DIAGNOSIS — G301 Alzheimer's disease with late onset: Secondary | ICD-10-CM | POA: Diagnosis not present

## 2019-03-23 DIAGNOSIS — E7800 Pure hypercholesterolemia, unspecified: Secondary | ICD-10-CM | POA: Diagnosis not present

## 2019-03-23 DIAGNOSIS — I10 Essential (primary) hypertension: Secondary | ICD-10-CM | POA: Diagnosis not present

## 2019-03-23 DIAGNOSIS — Z0000 Encounter for general adult medical examination without abnormal findings: Secondary | ICD-10-CM | POA: Diagnosis not present

## 2019-03-23 DIAGNOSIS — F0280 Dementia in other diseases classified elsewhere without behavioral disturbance: Secondary | ICD-10-CM | POA: Diagnosis not present

## 2019-03-23 DIAGNOSIS — Z862 Personal history of diseases of the blood and blood-forming organs and certain disorders involving the immune mechanism: Secondary | ICD-10-CM | POA: Diagnosis not present

## 2019-03-23 DIAGNOSIS — G5701 Lesion of sciatic nerve, right lower limb: Secondary | ICD-10-CM | POA: Diagnosis not present

## 2019-04-02 DIAGNOSIS — R002 Palpitations: Secondary | ICD-10-CM | POA: Diagnosis not present

## 2019-04-02 DIAGNOSIS — I10 Essential (primary) hypertension: Secondary | ICD-10-CM | POA: Diagnosis not present

## 2019-04-02 DIAGNOSIS — I2510 Atherosclerotic heart disease of native coronary artery without angina pectoris: Secondary | ICD-10-CM | POA: Diagnosis not present

## 2019-04-02 DIAGNOSIS — I471 Supraventricular tachycardia: Secondary | ICD-10-CM | POA: Diagnosis not present

## 2019-04-02 DIAGNOSIS — I5022 Chronic systolic (congestive) heart failure: Secondary | ICD-10-CM | POA: Diagnosis not present

## 2019-04-02 DIAGNOSIS — E7800 Pure hypercholesterolemia, unspecified: Secondary | ICD-10-CM | POA: Diagnosis not present

## 2019-04-14 DIAGNOSIS — H353122 Nonexudative age-related macular degeneration, left eye, intermediate dry stage: Secondary | ICD-10-CM | POA: Diagnosis not present

## 2019-04-14 DIAGNOSIS — R002 Palpitations: Secondary | ICD-10-CM | POA: Diagnosis not present

## 2019-04-23 DIAGNOSIS — S322XXA Fracture of coccyx, initial encounter for closed fracture: Secondary | ICD-10-CM | POA: Diagnosis not present

## 2019-04-23 DIAGNOSIS — W010XXA Fall on same level from slipping, tripping and stumbling without subsequent striking against object, initial encounter: Secondary | ICD-10-CM | POA: Diagnosis not present

## 2019-04-23 DIAGNOSIS — M545 Low back pain: Secondary | ICD-10-CM | POA: Diagnosis not present

## 2019-04-23 DIAGNOSIS — M5136 Other intervertebral disc degeneration, lumbar region: Secondary | ICD-10-CM | POA: Diagnosis not present

## 2019-04-23 DIAGNOSIS — I700 Atherosclerosis of aorta: Secondary | ICD-10-CM | POA: Diagnosis not present

## 2019-04-23 DIAGNOSIS — S300XXA Contusion of lower back and pelvis, initial encounter: Secondary | ICD-10-CM | POA: Diagnosis not present

## 2019-04-26 DIAGNOSIS — M8448XA Pathological fracture, other site, initial encounter for fracture: Secondary | ICD-10-CM | POA: Diagnosis not present

## 2019-04-27 ENCOUNTER — Other Ambulatory Visit: Payer: Self-pay | Admitting: Orthopedic Surgery

## 2019-04-27 DIAGNOSIS — M8448XA Pathological fracture, other site, initial encounter for fracture: Secondary | ICD-10-CM

## 2019-04-30 ENCOUNTER — Encounter
Admission: RE | Admit: 2019-04-30 | Discharge: 2019-04-30 | Disposition: A | Discharge status: Home / Self Care | Payer: Medicare HMO | Source: Ambulatory Visit | Attending: Orthopedic Surgery | Admitting: Orthopedic Surgery

## 2019-04-30 ENCOUNTER — Other Ambulatory Visit: Payer: Self-pay

## 2019-04-30 DIAGNOSIS — M8448XA Pathological fracture, other site, initial encounter for fracture: Secondary | ICD-10-CM | POA: Insufficient documentation

## 2019-04-30 DIAGNOSIS — M545 Low back pain: Secondary | ICD-10-CM | POA: Diagnosis not present

## 2019-04-30 DIAGNOSIS — Y92009 Unspecified place in unspecified non-institutional (private) residence as the place of occurrence of the external cause: Secondary | ICD-10-CM

## 2019-04-30 DIAGNOSIS — W19XXXA Unspecified fall, initial encounter: Secondary | ICD-10-CM

## 2019-04-30 DIAGNOSIS — S3992XA Unspecified injury of lower back, initial encounter: Secondary | ICD-10-CM | POA: Diagnosis not present

## 2019-04-30 HISTORY — DX: Unspecified fall, initial encounter: W19.XXXA

## 2019-04-30 HISTORY — DX: Unspecified place in unspecified non-institutional (private) residence as the place of occurrence of the external cause: Y92.009

## 2019-04-30 MED ORDER — TECHNETIUM TC 99M MEDRONATE IV KIT
20.0000 | PACK | Freq: Once | INTRAVENOUS | Status: AC | PRN
  Administered 2019-04-30: 21.499 via INTRAVENOUS

## 2019-05-06 DIAGNOSIS — H6123 Impacted cerumen, bilateral: Secondary | ICD-10-CM | POA: Diagnosis not present

## 2019-06-28 DIAGNOSIS — L821 Other seborrheic keratosis: Secondary | ICD-10-CM | POA: Diagnosis not present

## 2019-06-28 DIAGNOSIS — L570 Actinic keratosis: Secondary | ICD-10-CM | POA: Diagnosis not present

## 2019-06-28 DIAGNOSIS — Z08 Encounter for follow-up examination after completed treatment for malignant neoplasm: Secondary | ICD-10-CM | POA: Diagnosis not present

## 2019-06-28 DIAGNOSIS — Z85828 Personal history of other malignant neoplasm of skin: Secondary | ICD-10-CM | POA: Diagnosis not present

## 2019-07-21 DIAGNOSIS — H353122 Nonexudative age-related macular degeneration, left eye, intermediate dry stage: Secondary | ICD-10-CM | POA: Diagnosis not present

## 2019-08-24 DIAGNOSIS — M47816 Spondylosis without myelopathy or radiculopathy, lumbar region: Secondary | ICD-10-CM | POA: Diagnosis not present

## 2019-08-24 DIAGNOSIS — G301 Alzheimer's disease with late onset: Secondary | ICD-10-CM | POA: Diagnosis not present

## 2019-08-24 DIAGNOSIS — Z23 Encounter for immunization: Secondary | ICD-10-CM | POA: Diagnosis not present

## 2019-08-24 DIAGNOSIS — M545 Low back pain: Secondary | ICD-10-CM | POA: Diagnosis not present

## 2019-08-24 DIAGNOSIS — F0280 Dementia in other diseases classified elsewhere without behavioral disturbance: Secondary | ICD-10-CM | POA: Diagnosis not present

## 2019-08-24 DIAGNOSIS — Z8781 Personal history of (healed) traumatic fracture: Secondary | ICD-10-CM | POA: Diagnosis not present

## 2019-08-24 DIAGNOSIS — G8929 Other chronic pain: Secondary | ICD-10-CM | POA: Diagnosis not present

## 2019-09-20 DIAGNOSIS — Z862 Personal history of diseases of the blood and blood-forming organs and certain disorders involving the immune mechanism: Secondary | ICD-10-CM | POA: Diagnosis not present

## 2019-09-20 DIAGNOSIS — E7800 Pure hypercholesterolemia, unspecified: Secondary | ICD-10-CM | POA: Diagnosis not present

## 2019-09-20 DIAGNOSIS — I10 Essential (primary) hypertension: Secondary | ICD-10-CM | POA: Diagnosis not present

## 2019-09-27 DIAGNOSIS — Z0000 Encounter for general adult medical examination without abnormal findings: Secondary | ICD-10-CM | POA: Diagnosis not present

## 2019-10-20 DIAGNOSIS — H353122 Nonexudative age-related macular degeneration, left eye, intermediate dry stage: Secondary | ICD-10-CM | POA: Diagnosis not present

## 2019-12-21 DIAGNOSIS — M25551 Pain in right hip: Secondary | ICD-10-CM | POA: Diagnosis not present

## 2019-12-21 DIAGNOSIS — R195 Other fecal abnormalities: Secondary | ICD-10-CM | POA: Diagnosis not present

## 2019-12-21 DIAGNOSIS — W182XXD Fall in (into) shower or empty bathtub, subsequent encounter: Secondary | ICD-10-CM | POA: Diagnosis not present

## 2019-12-21 DIAGNOSIS — M25552 Pain in left hip: Secondary | ICD-10-CM | POA: Diagnosis not present

## 2019-12-21 DIAGNOSIS — S79912A Unspecified injury of left hip, initial encounter: Secondary | ICD-10-CM | POA: Diagnosis not present

## 2019-12-21 DIAGNOSIS — S79911A Unspecified injury of right hip, initial encounter: Secondary | ICD-10-CM | POA: Diagnosis not present

## 2019-12-25 ENCOUNTER — Emergency Department
Admission: EM | Admit: 2019-12-25 | Discharge: 2019-12-26 | Disposition: A | Discharge status: Home / Self Care | Payer: Medicare HMO | Attending: Student | Admitting: Student

## 2019-12-25 ENCOUNTER — Encounter: Payer: Self-pay | Admitting: Emergency Medicine

## 2019-12-25 ENCOUNTER — Other Ambulatory Visit: Payer: Self-pay

## 2019-12-25 ENCOUNTER — Emergency Department: Payer: Medicare HMO

## 2019-12-25 DIAGNOSIS — R410 Disorientation, unspecified: Secondary | ICD-10-CM | POA: Diagnosis not present

## 2019-12-25 DIAGNOSIS — I252 Old myocardial infarction: Secondary | ICD-10-CM | POA: Diagnosis not present

## 2019-12-25 DIAGNOSIS — F0390 Unspecified dementia without behavioral disturbance: Secondary | ICD-10-CM

## 2019-12-25 DIAGNOSIS — Z20822 Contact with and (suspected) exposure to covid-19: Secondary | ICD-10-CM | POA: Insufficient documentation

## 2019-12-25 DIAGNOSIS — Z79899 Other long term (current) drug therapy: Secondary | ICD-10-CM | POA: Insufficient documentation

## 2019-12-25 DIAGNOSIS — R4182 Altered mental status, unspecified: Secondary | ICD-10-CM | POA: Diagnosis not present

## 2019-12-25 DIAGNOSIS — S0990XA Unspecified injury of head, initial encounter: Secondary | ICD-10-CM | POA: Diagnosis not present

## 2019-12-25 DIAGNOSIS — Z03818 Encounter for observation for suspected exposure to other biological agents ruled out: Secondary | ICD-10-CM | POA: Diagnosis not present

## 2019-12-25 DIAGNOSIS — Z7982 Long term (current) use of aspirin: Secondary | ICD-10-CM | POA: Diagnosis not present

## 2019-12-25 DIAGNOSIS — S199XXA Unspecified injury of neck, initial encounter: Secondary | ICD-10-CM | POA: Diagnosis not present

## 2019-12-25 LAB — CBC
HCT: 41.7 % (ref 39.0–52.0)
Hemoglobin: 13.5 g/dL (ref 13.0–17.0)
MCH: 31.7 pg (ref 26.0–34.0)
MCHC: 32.4 g/dL (ref 30.0–36.0)
MCV: 97.9 fL (ref 80.0–100.0)
Platelets: 149 10*3/uL — ABNORMAL LOW (ref 150–400)
RBC: 4.26 MIL/uL (ref 4.22–5.81)
RDW: 14.2 % (ref 11.5–15.5)
WBC: 6.4 10*3/uL (ref 4.0–10.5)
nRBC: 0 % (ref 0.0–0.2)

## 2019-12-25 LAB — RESPIRATORY PANEL BY RT PCR (FLU A&B, COVID)
Influenza A by PCR: NEGATIVE
Influenza B by PCR: NEGATIVE
SARS Coronavirus 2 by RT PCR: NEGATIVE

## 2019-12-25 LAB — COMPREHENSIVE METABOLIC PANEL
ALT: 15 U/L (ref 0–44)
AST: 20 U/L (ref 15–41)
Albumin: 3.9 g/dL (ref 3.5–5.0)
Alkaline Phosphatase: 61 U/L (ref 38–126)
Anion gap: 9 (ref 5–15)
BUN: 16 mg/dL (ref 8–23)
CO2: 26 mmol/L (ref 22–32)
Calcium: 8.8 mg/dL — ABNORMAL LOW (ref 8.9–10.3)
Chloride: 107 mmol/L (ref 98–111)
Creatinine, Ser: 0.87 mg/dL (ref 0.61–1.24)
GFR calc Af Amer: >60 mL/min (ref >60)
GFR calc non Af Amer: >60 mL/min (ref >60)
Glucose, Bld: 141 mg/dL — ABNORMAL HIGH (ref 70–99)
Potassium: 3.6 mmol/L (ref 3.5–5.1)
Sodium: 142 mmol/L (ref 135–145)
Total Bilirubin: 0.9 mg/dL (ref 0.3–1.2)
Total Protein: 6.5 g/dL (ref 6.5–8.1)

## 2019-12-25 LAB — URINALYSIS, COMPLETE (UACMP) WITH MICROSCOPIC
Bacteria, UA: NONE SEEN
Bilirubin Urine: NEGATIVE
Glucose, UA: NEGATIVE mg/dL
Hgb urine dipstick: NEGATIVE
Ketones, ur: NEGATIVE mg/dL
Leukocytes,Ua: NEGATIVE
Nitrite: NEGATIVE
Protein, ur: NEGATIVE mg/dL
Specific Gravity, Urine: 1.012 (ref 1.005–1.030)
Squamous Epithelial / LPF: NONE SEEN (ref 0–5)
WBC, UA: NONE SEEN WBC/hpf (ref 0–5)
pH: 5 (ref 5.0–8.0)

## 2019-12-25 LAB — TROPONIN I (HIGH SENSITIVITY): Troponin I (High Sensitivity): 14 ng/L (ref <18)

## 2019-12-25 LAB — MAGNESIUM: Magnesium: 1.9 mg/dL (ref 1.7–2.4)

## 2019-12-25 LAB — PROTIME-INR
INR: 1.1 (ref 0.8–1.2)
Prothrombin Time: 14.1 seconds (ref 11.4–15.2)

## 2019-12-25 NOTE — ED Triage Notes (Signed)
Pt arrives POV to triage with c/o AMS. Per son, pt woke up from a nap and was confused to place and did not recognize his wife. Son also reports that pt fell last Sunday in the bathtub.

## 2019-12-25 NOTE — ED Notes (Signed)
Pt transported to CT ?

## 2019-12-25 NOTE — ED Notes (Signed)
Pt is unsure of presidents name but is able to state month and where he is at. Pt is able to discuss in detail MI 28 years ago including exact date and situation. Pt also know his cardiologist is Dr. Nehemiah Massed. Per son, pt seems to be making more sense at this time and talking clearly.

## 2019-12-26 LAB — TROPONIN I (HIGH SENSITIVITY): Troponin I (High Sensitivity): 14 ng/L (ref <18)

## 2019-12-26 NOTE — ED Provider Notes (Signed)
[facility] Emergency Department Provider Note  ____________________________________________   First MD Initiated Contact with Patient 12/25/19 2103     (approximate)  I have reviewed the triage vital signs and the nursing notes.  History  Chief Complaint Altered Mental Status    HPI [name] is a 86 y.o. male with hx of CAD, dementia who presents to the ED for increased confusion. Son at bedside states when the patient woke from a nap this evening he did not recognize who his wife was. He was asking to go home even though he was already at home. Thinks his son is his deceased brother in law.   Son states that he does have a hx of dementia (on Aricept) but usually is not this bad. He did have a fall with head injury last week.   Patient himself has no acute complaints.   Caveat: hx limited due to patient's dementia. Primarily obtained from son at bedside.    Past Medical Hx Past Medical History:  Diagnosis Date  . Fall at home 04/30/2019   Fall at home 2 weeks ago  . MI, old     Problem List Patient Active Problem List   Diagnosis Date Noted  . Chest pain 06/05/2018    Past Surgical Hx Past Surgical History:  Procedure Laterality Date  . CORONARY ANGIOPLASTY WITH STENT PLACEMENT    . hemmorroid    . ROTATOR CUFF REPAIR      Medications Prior to Admission medications   Medication Sig Start Date End Date Taking? Authorizing Provider  acetaminophen (TYLENOL) 500 MG tablet Take 500 mg by mouth every 6 (six) hours as needed for mild pain.     [provider]  aspirin 81 MG tablet Take 81 mg by mouth daily.    [provider]  atorvastatin (LIPITOR) 20 MG tablet Take 20 mg by mouth daily.    [provider]  carvedilol (COREG) 3.125 MG tablet Take 3.125 mg by mouth 2 (two) times daily with a meal.    [provider]  lisinopril (PRINIVIL,ZESTRIL) 10 MG tablet Take 10 mg by mouth daily.    [provider]  nitroGLYCERIN (NITROSTAT) 0.4 MG SL tablet Place 1 tablet (0.4 mg total) under the tongue every 5 (five) minutes as needed for chest pain. 06/06/18   Bettey Costa, MD  omeprazole (PRILOSEC) 20 MG capsule Take 20 mg by mouth daily.    [provider]  spironolactone (ALDACTONE) 25 MG tablet Take 25 mg by mouth daily.    [provider]    Allergies Patient has no known allergies.  Family Hx No family history on file.  Social Hx Social History   Tobacco Use  . Smoking status: Never Smoker  . Smokeless tobacco: Never Used  Substance Use Topics  . Alcohol use: No  . Drug use: Never     Review of Systems Unable to reliably obtain due to patient's history of dementia.  Physical Exam  Vital Signs: ED Triage Vitals  Enc Vitals Group     BP 12/25/19 2043 (!) 155/77     Pulse Rate 12/25/19 2043 69     Resp 12/25/19 2043 17     Temp 12/25/19 2043 97.9 F (36.6 C)     Temp Source 12/25/19 2043 Oral     SpO2 12/25/19 2043 98 %     Weight 12/25/19 2044 177 lb (80.3 kg)     Height 12/25/19 2044 5\' 8"  (1.727  m)     Head Circumference --      Peak Flow --      Pain Score 12/25/19 2043 0     Pain Loc --      Pain Edu? --      Excl. in [place]? --     Constitutional: Awake and alert.  Pleasant.  Oriented to self, date of birth.  Does not know the year.  Thinks son at bedside is his deceased brother-in-law. Head: Normocephalic. Atraumatic. Eyes: Conjunctivae clear. Sclera anicteric. Nose: No congestion. No rhinorrhea. Mouth/Throat: Wearing mask.  Neck: No stridor.   Cardiovascular: Normal rate, regular rhythm. Extremities well perfused. Respiratory: Normal respiratory effort.  Lungs CTAB. Gastrointestinal: Soft. Non-tender. Non-distended.  Musculoskeletal: No lower extremity edema. No deformities. Neurologic: No lateralizing neurological deficits.  Moves all extremities equally. Skin: Skin is warm, dry and intact. No rash noted. Psychiatric: Pleasant  and cooperative.  EKG  Personally reviewed.   Rate: 62 Rhythm: sinus Axis: LAD Intervals: QRS 116, incomplete RBBB, LAFB PVCs No STEMI    Radiology  CXR: IMPRESSION:  Cardiomegaly and aortic atherosclerosis.  Poor inspiration. No active disease suspected. Could not rule out a  degree of pulmonary venous hypertension.   CT head:  IMPRESSION:  Chronic ischemic microangiopathy without acute intracranial  abnormality.   CT CS:  IMPRESSION:  No acute finding in the cervical or upper thoracic region. Chronic  degenerative spondylosis. Old minor endplate deformities inferior T2  and superior left T3.    Procedures  Procedure(s) performed (including critical care):  Procedures   Initial Impression / Assessment and Plan / ED Course  86 y.o. male with history of dementia who presents to the ED for increased confusion above baseline  Ddx: UTI, electrolyte abnormality, intracranial injury 2/2 fall last week, COVID, progression of known dementia  Will evaluate with labs, urine studies, imaging.  If work-up is unremarkable, suspect presentation likely related to progressing dementia.  Son is at bedside and does feel comfortable with his return home if work-up is unremarkable, and will work on securing further resources via PCP.  CT imaging negative for any acute findings.  UA without evidence of infection.  CBC, electrolytes without actionable derangements.   Final Clinical Impression(s) / ED Diagnosis  Final diagnoses:  Confusion       Note:  This document was prepared using Dragon voice recognition software and may include unintentional dictation errors.   Lilia Pro., MD 12/26/19 [phone]

## 2019-12-27 DIAGNOSIS — F0391 Unspecified dementia with behavioral disturbance: Secondary | ICD-10-CM | POA: Diagnosis not present

## 2019-12-31 ENCOUNTER — Ambulatory Visit: Payer: Medicare HMO | Attending: Internal Medicine

## 2019-12-31 DIAGNOSIS — Z23 Encounter for immunization: Secondary | ICD-10-CM

## 2019-12-31 NOTE — Progress Notes (Signed)
   Covid-19 Vaccination Clinic  Name:  [name]    MRN: [id number] DOB: September 14, 1927  12/31/2019  [name] was observed post Covid-19 immunization for 15 minutes without incidence. He was provided with Vaccine Information Sheet and instruction to access the V-Safe system.   [name] was instructed to call 911 with any severe reactions post vaccine: Marland Kitchen Difficulty breathing  . Swelling of your face and throat  . A fast heartbeat  . A bad rash all over your body  . Dizziness and weakness    Immunizations Administered    Name Date Dose VIS Date Route   Pfizer COVID-19 Vaccine 12/31/2019 12:57 PM 0.3 mL 10/15/2019 Intramuscular   Manufacturer: [place]   Lot: [id number]   [place]: [id number]

## 2020-01-19 DIAGNOSIS — H353221 Exudative age-related macular degeneration, left eye, with active choroidal neovascularization: Secondary | ICD-10-CM | POA: Diagnosis not present

## 2020-01-19 DIAGNOSIS — H353112 Nonexudative age-related macular degeneration, right eye, intermediate dry stage: Secondary | ICD-10-CM | POA: Diagnosis not present

## 2020-02-01 ENCOUNTER — Ambulatory Visit: Payer: Medicare HMO | Attending: Internal Medicine

## 2020-02-01 DIAGNOSIS — Z23 Encounter for immunization: Secondary | ICD-10-CM

## 2020-02-01 NOTE — Progress Notes (Signed)
   Covid-19 Vaccination Clinic  Name:  [name]    MRN: [id number] DOB: 1927/01/14  02/01/2020  Mr. [name] was observed post Covid-19 immunization for 15 minutes without incident. He was provided with Vaccine Information Sheet and instruction to access the V-Safe system.   Mr. [name] was instructed to call 911 with any severe reactions post vaccine: Marland Kitchen Difficulty breathing  . Swelling of face and throat  . A fast heartbeat  . A bad rash all over body  . Dizziness and weakness   Immunizations Administered    Name Date Dose VIS Date Route   Pfizer COVID-19 Vaccine 02/01/2020  9:55 AM 0.3 mL 10/15/2019 Intramuscular   Manufacturer: [place]   Lot: [id number]   [place]: [id number]

## 2020-02-21 ENCOUNTER — Observation Stay: Payer: Medicare HMO

## 2020-02-21 ENCOUNTER — Other Ambulatory Visit: Payer: Self-pay

## 2020-02-21 ENCOUNTER — Encounter: Payer: Self-pay | Admitting: Emergency Medicine

## 2020-02-21 ENCOUNTER — Emergency Department: Payer: Medicare HMO

## 2020-02-21 ENCOUNTER — Observation Stay
Admission: EM | Admit: 2020-02-21 | Discharge: 2020-02-22 | Disposition: A | Discharge status: Home / Self Care | Payer: Medicare HMO | Attending: Emergency Medicine | Admitting: Emergency Medicine

## 2020-02-21 DIAGNOSIS — I081 Rheumatic disorders of both mitral and tricuspid valves: Secondary | ICD-10-CM | POA: Diagnosis not present

## 2020-02-21 DIAGNOSIS — I5022 Chronic systolic (congestive) heart failure: Secondary | ICD-10-CM | POA: Insufficient documentation

## 2020-02-21 DIAGNOSIS — Z8249 Family history of ischemic heart disease and other diseases of the circulatory system: Secondary | ICD-10-CM | POA: Diagnosis not present

## 2020-02-21 DIAGNOSIS — I6200 Nontraumatic subdural hemorrhage, unspecified: Secondary | ICD-10-CM | POA: Insufficient documentation

## 2020-02-21 DIAGNOSIS — I252 Old myocardial infarction: Secondary | ICD-10-CM | POA: Insufficient documentation

## 2020-02-21 DIAGNOSIS — W19XXXA Unspecified fall, initial encounter: Secondary | ICD-10-CM | POA: Diagnosis not present

## 2020-02-21 DIAGNOSIS — E041 Nontoxic single thyroid nodule: Secondary | ICD-10-CM | POA: Diagnosis not present

## 2020-02-21 DIAGNOSIS — K219 Gastro-esophageal reflux disease without esophagitis: Secondary | ICD-10-CM | POA: Diagnosis not present

## 2020-02-21 DIAGNOSIS — Z79899 Other long term (current) drug therapy: Secondary | ICD-10-CM | POA: Diagnosis not present

## 2020-02-21 DIAGNOSIS — I2510 Atherosclerotic heart disease of native coronary artery without angina pectoris: Secondary | ICD-10-CM | POA: Diagnosis not present

## 2020-02-21 DIAGNOSIS — I491 Atrial premature depolarization: Secondary | ICD-10-CM | POA: Diagnosis not present

## 2020-02-21 DIAGNOSIS — S065X0A Traumatic subdural hemorrhage without loss of consciousness, initial encounter: Secondary | ICD-10-CM | POA: Diagnosis not present

## 2020-02-21 DIAGNOSIS — I10 Essential (primary) hypertension: Secondary | ICD-10-CM | POA: Diagnosis not present

## 2020-02-21 DIAGNOSIS — I110 Hypertensive heart disease with heart failure: Secondary | ICD-10-CM | POA: Insufficient documentation

## 2020-02-21 DIAGNOSIS — I498 Other specified cardiac arrhythmias: Secondary | ICD-10-CM

## 2020-02-21 DIAGNOSIS — S065X9A Traumatic subdural hemorrhage with loss of consciousness of unspecified duration, initial encounter: Secondary | ICD-10-CM

## 2020-02-21 DIAGNOSIS — Z955 Presence of coronary angioplasty implant and graft: Secondary | ICD-10-CM | POA: Diagnosis not present

## 2020-02-21 DIAGNOSIS — S065XAA Traumatic subdural hemorrhage with loss of consciousness status unknown, initial encounter: Secondary | ICD-10-CM

## 2020-02-21 DIAGNOSIS — R296 Repeated falls: Secondary | ICD-10-CM | POA: Diagnosis not present

## 2020-02-21 DIAGNOSIS — R55 Syncope and collapse: Secondary | ICD-10-CM | POA: Diagnosis not present

## 2020-02-21 DIAGNOSIS — I4891 Unspecified atrial fibrillation: Secondary | ICD-10-CM | POA: Diagnosis not present

## 2020-02-21 DIAGNOSIS — S0101XA Laceration without foreign body of scalp, initial encounter: Secondary | ICD-10-CM | POA: Insufficient documentation

## 2020-02-21 DIAGNOSIS — I6782 Cerebral ischemia: Secondary | ICD-10-CM | POA: Insufficient documentation

## 2020-02-21 DIAGNOSIS — R001 Bradycardia, unspecified: Secondary | ICD-10-CM | POA: Diagnosis not present

## 2020-02-21 DIAGNOSIS — R58 Hemorrhage, not elsewhere classified: Secondary | ICD-10-CM | POA: Diagnosis not present

## 2020-02-21 DIAGNOSIS — R008 Other abnormalities of heart beat: Secondary | ICD-10-CM | POA: Diagnosis not present

## 2020-02-21 DIAGNOSIS — Z808 Family history of malignant neoplasm of other organs or systems: Secondary | ICD-10-CM | POA: Diagnosis not present

## 2020-02-21 DIAGNOSIS — F0390 Unspecified dementia without behavioral disturbance: Secondary | ICD-10-CM | POA: Insufficient documentation

## 2020-02-21 DIAGNOSIS — R000 Tachycardia, unspecified: Secondary | ICD-10-CM | POA: Insufficient documentation

## 2020-02-21 DIAGNOSIS — Z20822 Contact with and (suspected) exposure to covid-19: Secondary | ICD-10-CM | POA: Insufficient documentation

## 2020-02-21 DIAGNOSIS — Z7982 Long term (current) use of aspirin: Secondary | ICD-10-CM | POA: Insufficient documentation

## 2020-02-21 DIAGNOSIS — S199XXA Unspecified injury of neck, initial encounter: Secondary | ICD-10-CM | POA: Diagnosis not present

## 2020-02-21 DIAGNOSIS — E785 Hyperlipidemia, unspecified: Secondary | ICD-10-CM | POA: Insufficient documentation

## 2020-02-21 LAB — CBC WITH DIFFERENTIAL/PLATELET
Abs Immature Granulocytes: 0.04 10*3/uL (ref 0.00–0.07)
Basophils Absolute: 0.1 10*3/uL (ref 0.0–0.1)
Basophils Relative: 1 %
Eosinophils Absolute: 0.4 10*3/uL (ref 0.0–0.5)
Eosinophils Relative: 5 %
HCT: 41.3 % (ref 39.0–52.0)
Hemoglobin: 13.3 g/dL (ref 13.0–17.0)
Immature Granulocytes: 1 %
Lymphocytes Relative: 24 %
Lymphs Abs: 1.7 10*3/uL (ref 0.7–4.0)
MCH: 31.8 pg (ref 26.0–34.0)
MCHC: 32.2 g/dL (ref 30.0–36.0)
MCV: 98.8 fL (ref 80.0–100.0)
Monocytes Absolute: 0.8 10*3/uL (ref 0.1–1.0)
Monocytes Relative: 11 %
Neutro Abs: 4.2 10*3/uL (ref 1.7–7.7)
Neutrophils Relative %: 58 %
Platelets: 133 10*3/uL — ABNORMAL LOW (ref 150–400)
RBC: 4.18 MIL/uL — ABNORMAL LOW (ref 4.22–5.81)
RDW: 13.8 % (ref 11.5–15.5)
WBC: 7.2 10*3/uL (ref 4.0–10.5)
nRBC: 0 % (ref 0.0–0.2)

## 2020-02-21 LAB — BASIC METABOLIC PANEL
Anion gap: 9 (ref 5–15)
BUN: 26 mg/dL — ABNORMAL HIGH (ref 8–23)
CO2: 24 mmol/L (ref 22–32)
Calcium: 8.6 mg/dL — ABNORMAL LOW (ref 8.9–10.3)
Chloride: 110 mmol/L (ref 98–111)
Creatinine, Ser: 1.07 mg/dL (ref 0.61–1.24)
GFR calc Af Amer: >60 mL/min (ref >60)
GFR calc non Af Amer: >60 mL/min (ref >60)
Glucose, Bld: 111 mg/dL — ABNORMAL HIGH (ref 70–99)
Potassium: 4.2 mmol/L (ref 3.5–5.1)
Sodium: 143 mmol/L (ref 135–145)

## 2020-02-21 LAB — TROPONIN I (HIGH SENSITIVITY)
Troponin I (High Sensitivity): 11 ng/L (ref <18)
Troponin I (High Sensitivity): 12 ng/L (ref <18)

## 2020-02-21 LAB — MAGNESIUM: Magnesium: 2.1 mg/dL (ref 1.7–2.4)

## 2020-02-21 MED ORDER — LIDOCAINE-EPINEPHRINE 2 %-1:100000 IJ SOLN
20.0000 mL | Freq: Once | INTRAMUSCULAR | Status: AC
  Administered 2020-02-21: 20 mL
  Filled 2020-02-21: qty 1

## 2020-02-21 MED ORDER — SPIRONOLACTONE 25 MG PO TABS
25.0000 mg | ORAL_TABLET | Freq: Every day | ORAL | Status: DC
  Administered 2020-02-22: 25 mg via ORAL
  Filled 2020-02-21: qty 1

## 2020-02-21 MED ORDER — CARVEDILOL 6.25 MG PO TABS
3.1250 mg | ORAL_TABLET | Freq: Two times a day (BID) | ORAL | Status: DC
  Administered 2020-02-22: 3.125 mg via ORAL
  Filled 2020-02-21: qty 1

## 2020-02-21 MED ORDER — PANTOPRAZOLE SODIUM 40 MG PO TBEC
40.0000 mg | DELAYED_RELEASE_TABLET | Freq: Every day | ORAL | Status: DC
  Administered 2020-02-21 – 2020-02-22 (×2): 40 mg via ORAL
  Filled 2020-02-21 (×2): qty 1

## 2020-02-21 MED ORDER — SODIUM CHLORIDE 0.9 % IV SOLN: INTRAVENOUS | Status: DC

## 2020-02-21 MED ORDER — ACETAMINOPHEN 325 MG PO TABS: 650.0000 mg | ORAL_TABLET | Freq: Four times a day (QID) | ORAL | Status: DC | PRN

## 2020-02-21 MED ORDER — ASPIRIN EC 81 MG PO TBEC: 81.0000 mg | DELAYED_RELEASE_TABLET | Freq: Every day | ORAL | Status: DC

## 2020-02-21 MED ORDER — MAGNESIUM HYDROXIDE 400 MG/5ML PO SUSP: 30.0000 mL | Freq: Every day | ORAL | Status: DC | PRN

## 2020-02-21 MED ORDER — ACETAMINOPHEN 650 MG RE SUPP: 650.0000 mg | Freq: Four times a day (QID) | RECTAL | Status: DC | PRN

## 2020-02-21 MED ORDER — LISINOPRIL 10 MG PO TABS
10.0000 mg | ORAL_TABLET | Freq: Every day | ORAL | Status: DC
  Administered 2020-02-22: 10 mg via ORAL
  Filled 2020-02-21: qty 1

## 2020-02-21 MED ORDER — NITROGLYCERIN 0.4 MG SL SUBL: 0.4000 mg | SUBLINGUAL_TABLET | SUBLINGUAL | Status: DC | PRN

## 2020-02-21 MED ORDER — ONDANSETRON HCL 4 MG PO TABS: 4.0000 mg | ORAL_TABLET | Freq: Four times a day (QID) | ORAL | Status: DC | PRN

## 2020-02-21 MED ORDER — HYDRALAZINE HCL 20 MG/ML IJ SOLN: 10.0000 mg | Freq: Four times a day (QID) | INTRAMUSCULAR | Status: DC | PRN

## 2020-02-21 MED ORDER — ATORVASTATIN CALCIUM 20 MG PO TABS
20.0000 mg | ORAL_TABLET | Freq: Every day | ORAL | Status: DC
  Administered 2020-02-21 – 2020-02-22 (×2): 20 mg via ORAL
  Filled 2020-02-21 (×2): qty 1

## 2020-02-21 MED ORDER — SODIUM CHLORIDE 0.9% FLUSH: 3.0000 mL | Freq: Two times a day (BID) | INTRAVENOUS | Status: DC

## 2020-02-21 MED ORDER — ONDANSETRON HCL 4 MG/2ML IJ SOLN: 4.0000 mg | Freq: Four times a day (QID) | INTRAMUSCULAR | Status: DC | PRN

## 2020-02-21 MED ORDER — BACITRACIN ZINC 500 UNIT/GM EX OINT
TOPICAL_OINTMENT | Freq: Once | CUTANEOUS | Status: AC
  Administered 2020-02-21: 1 via TOPICAL
  Filled 2020-02-21: qty 0.9

## 2020-02-21 NOTE — H&P (Addendum)
[facility] NAME: [name]    MR#:  [id number]  DATE OF BIRTH:  [birth year]  DATE OF ADMISSION:  02/21/2020  PRIMARY CARE PHYSICIAN: Dion Body, MD   REQUESTING/REFERRING PHYSICIAN: Blake Divine, MD  CHIEF COMPLAINT:   Chief Complaint  Patient presents with  . Fall  . Atrial Fibrillation    HISTORY OF PRESENT ILLNESS:  [name]  is a 86 y.o. Caucasian male with a known history of coronary disease status post PCI and stent, who presented to the emergency room with acute onset of syncope while working in his yard with subsequent head injury.  The patient does not remember how he fell.  He denied any headache or dizziness or blurred vision prior to his fall.  No reported chest pain or dyspnea or palpitations before or after his fall.  No reported paresthesias or focal muscle weakness.  He fell backward onto concrete floor and hit his head.  He apparently lives close to the fire department which responded first and called EMS.  Upon EMS arrival, the patient was noted to have a laceration to the back of his head as well S tachycardia.  He was thought to be in atrial fibrillation with RVR and was given 2.5 mg of IV Lopressor.  Upon presentation to the emergency room, blood pressure was 145/69 with otherwise normal vital signs.  Labs revealed BUN of 26 and creatinine 1.07 compared to 16/0.87 on 12/25/2019 high-sensitivity troponin I was 11. EKG showed normal sinus rhythm with a rate of 86 with ventricular bigeminy and intraventricular conduction delay and atypical right bundle branch block.  Noncontrasted head CT scan and C-spine CT revealed: 1. New tiny acute subdural hematoma along the anterior inferior right frontal lobe measuring up to 3 mm in maximal thickness. 2. Small posterior midline occipital scalp hematoma and laceration. 3. No acute cervical spine fracture or traumatic listhesis. Unchanged advanced multilevel cervical  spondylosis.  The patient had a repair of an upper occipital laceration in the ER with 3 sutures with good hemostasis.  Dr. Lacinda Axon was notified about the patient.  He recommended having the patient observed here and have repeat head CT scan which is scheduled at 11 PM.  If it shows enlarging subdural hematoma then the patient will need to be transferred to tertiary facility.  PAST MEDICAL HISTORY:   Past Medical History:  Diagnosis Date  . Fall at home 04/30/2019   Fall at home 2 weeks ago  . MI, old   Cognitive dysfunction/early dementia GERD Hypertension Dyslipidemia History of SVT Moderate mitral and tricuspid insufficiency. Systolic CHF with EF of [id number] as of October/2016  PAST SURGICAL HISTORY:   Past Surgical History:  Procedure Laterality Date  . CORONARY ANGIOPLASTY WITH STENT PLACEMENT    . hemmorroid    . ROTATOR CUFF REPAIR      SOCIAL HISTORY:   Social History   Tobacco Use  . Smoking status: Never Smoker  . Smokeless tobacco: Never Used  Substance Use Topics  . Alcohol use: No    FAMILY HISTORY:  Both parents died from heart disease.  He has a brother who died from bone cancer.  DRUG ALLERGIES:  No Known Allergies  REVIEW OF SYSTEMS:   ROS As per history of present illness. All pertinent systems were reviewed above. Constitutional,  HEENT, cardiovascular, respiratory, GI, GU, musculoskeletal, neuro, psychiatric, endocrine,  integumentary and hematologic systems were reviewed and are otherwise  negative/unremarkable except for positive findings  mentioned above in the HPI.   MEDICATIONS AT HOME:   Prior to Admission medications   Medication Sig Start Date End Date Taking? Authorizing Provider  acetaminophen (TYLENOL) 500 MG tablet Take 500 mg by mouth every 6 (six) hours as needed for mild pain.     [provider]  aspirin 81 MG tablet Take 81 mg by mouth daily.    [provider]  atorvastatin (LIPITOR) 20 MG tablet Take 20 mg  by mouth daily.    [provider]  carvedilol (COREG) 3.125 MG tablet Take 3.125 mg by mouth 2 (two) times daily with a meal.    [provider]  lisinopril (PRINIVIL,ZESTRIL) 10 MG tablet Take 10 mg by mouth daily.    [provider]  nitroGLYCERIN (NITROSTAT) 0.4 MG SL tablet Place 1 tablet (0.4 mg total) under the tongue every 5 (five) minutes as needed for chest pain. 06/06/18   Bettey Costa, MD  omeprazole (PRILOSEC) 20 MG capsule Take 20 mg by mouth daily.    [provider]  spironolactone (ALDACTONE) 25 MG tablet Take 25 mg by mouth daily.    [provider]      VITAL SIGNS:  Blood pressure 138/75, pulse (!) 35, temperature 98.4 F (36.9 C), temperature source Oral, resp. rate 20, height 6' (1.829 m), weight 87.1 kg, SpO2 97 %.  PHYSICAL EXAMINATION:  Physical Exam  GENERAL:  86 y.o.-year-old Caucasian male patient lying in the bed with no acute distress.  EYES: Pupils equal, round, reactive to light and accommodation. No scleral icterus. Extraocular muscles intact.  HEENT: Head atraumatic, normocephalic. Oropharynx and nasopharynx clear.  NECK:  Supple, no jugular venous distention. No thyroid enlargement, no tenderness.  LUNGS: Normal breath sounds bilaterally, no wheezing, rales,rhonchi or crepitation. No use of accessory muscles of respiration.  CARDIOVASCULAR: Regular rate and rhythm, S1, S2 normal. No murmurs, rubs, or gallops.  ABDOMEN: Soft, nondistended, nontender. Bowel sounds present. No organomegaly or mass.  EXTREMITIES: No pedal edema, cyanosis, or clubbing.  NEUROLOGIC: Cranial nerves II through XII are intact. Muscle strength 5/5 in all extremities. Sensation intact. Gait not checked.  PSYCHIATRIC: The patient is alert and oriented x 3.  Normal affect and good eye contact. SKIN: He has an upper occipital laceration that was repaired with 3 sutures with good hemostasis.  LABORATORY PANEL:   CBC Recent Labs  Lab  02/21/20 1551  WBC 7.2  HGB 13.3  HCT 41.3  PLT 133*   ------------------------------------------------------------------------------------------------------------------  Chemistries  Recent Labs  Lab 02/21/20 1551  NA 143  K 4.2  CL 110  CO2 24  GLUCOSE 111*  BUN 26*  CREATININE 1.07  CALCIUM 8.6*  MG 2.1   ------------------------------------------------------------------------------------------------------------------  Cardiac Enzymes No results for input(s): TROPONINI in the last 168 hours. ------------------------------------------------------------------------------------------------------------------  RADIOLOGY:  CT Head Wo Contrast  Result Date: 02/21/2020 CLINICAL DATA:  Fall. EXAM: CT HEAD WITHOUT CONTRAST CT CERVICAL SPINE WITHOUT CONTRAST TECHNIQUE: Multidetector CT imaging of the head and cervical spine was performed following the standard protocol without intravenous contrast. Multiplanar CT image reconstructions of the cervical spine were also generated. COMPARISON:  CT head and cervical spine dated December 25, 2019. FINDINGS: CT HEAD FINDINGS Brain: New tiny acute subdural hematoma along the anterior inferior right frontal lobe measuring up to 3 mm in maximal thickness (series 5, image 24). No evidence of acute infarction, hydrocephalus, or mass lesion/mass effect. Stable atrophy and chronic microvascular ischemic changes. Vascular: Calcified atherosclerosis at the skullbase. No hyperdense  vessel. Skull: Normal. Negative for fracture or focal lesion. Sinuses/Orbits: No acute finding. Other: Small posterior midline occipital scalp hematoma and laceration. CT CERVICAL SPINE FINDINGS Alignment: No traumatic malalignment. Chronic reversal of the normal cervical lordosis. Skull base and vertebrae: No acute fracture. No primary bone lesion or focal pathologic process. Soft tissues and spinal canal: No prevertebral fluid or swelling. No visible canal hematoma. Disc levels:  Unchanged moderate to severe disc height loss and uncovertebral hypertrophy from C4-C5 through C7-T1. Unchanged moderate facet arthropathy at C2-C3 and C3-C4 with diffuse left C3-C4 facet joint. Upper chest: Mild centrilobular emphysema. Other: 1.1 cm right thyroid nodule, unchanged since 2008. Stability for greater than 5 years implies benignity; no biopsy or followup indicated. IMPRESSION: 1. New tiny acute subdural hematoma along the anterior inferior right frontal lobe measuring up to 3 mm in maximal thickness. 2. Small posterior midline occipital scalp hematoma and laceration. 3. No acute cervical spine fracture or traumatic listhesis. Unchanged advanced multilevel cervical spondylosis. Critical Value/emergent results were called by telephone at the time of interpretation on 02/21/2020 at 4:38 pm to provider [facility] , who verbally acknowledged these results. Electronically Signed   By: Titus Dubin M.D.   On: 02/21/2020 16:39   CT Cervical Spine Wo Contrast  Result Date: 02/21/2020 CLINICAL DATA:  Fall. EXAM: CT HEAD WITHOUT CONTRAST CT CERVICAL SPINE WITHOUT CONTRAST TECHNIQUE: Multidetector CT imaging of the head and cervical spine was performed following the standard protocol without intravenous contrast. Multiplanar CT image reconstructions of the cervical spine were also generated. COMPARISON:  CT head and cervical spine dated December 25, 2019. FINDINGS: CT HEAD FINDINGS Brain: New tiny acute subdural hematoma along the anterior inferior right frontal lobe measuring up to 3 mm in maximal thickness (series 5, image 24). No evidence of acute infarction, hydrocephalus, or mass lesion/mass effect. Stable atrophy and chronic microvascular ischemic changes. Vascular: Calcified atherosclerosis at the skullbase. No hyperdense vessel. Skull: Normal. Negative for fracture or focal lesion. Sinuses/Orbits: No acute finding. Other: Small posterior midline occipital scalp hematoma and laceration. CT  CERVICAL SPINE FINDINGS Alignment: No traumatic malalignment. Chronic reversal of the normal cervical lordosis. Skull base and vertebrae: No acute fracture. No primary bone lesion or focal pathologic process. Soft tissues and spinal canal: No prevertebral fluid or swelling. No visible canal hematoma. Disc levels: Unchanged moderate to severe disc height loss and uncovertebral hypertrophy from C4-C5 through C7-T1. Unchanged moderate facet arthropathy at C2-C3 and C3-C4 with diffuse left C3-C4 facet joint. Upper chest: Mild centrilobular emphysema. Other: 1.1 cm right thyroid nodule, unchanged since 2008. Stability for greater than 5 years implies benignity; no biopsy or followup indicated. IMPRESSION: 1. New tiny acute subdural hematoma along the anterior inferior right frontal lobe measuring up to 3 mm in maximal thickness. 2. Small posterior midline occipital scalp hematoma and laceration. 3. No acute cervical spine fracture or traumatic listhesis. Unchanged advanced multilevel cervical spondylosis. Critical Value/emergent results were called by telephone at the time of interpretation on 02/21/2020 at 4:38 pm to provider [facility] , who verbally acknowledged these results. Electronically Signed   By: Titus Dubin M.D.   On: 02/21/2020 16:39      IMPRESSION AND PLAN:   1.  Syncope with subsequent fall, head injury and right frontal 3 mm subdural hematoma and occipital laceration status post repair. -The patient will be admitted to stepdown unit bed. -Differential diagnosis for syncope will include neurally mediated, arrhythmia related, orthostatic hypotension, cardiogenic and less likely hypoglycemic.  He is  mildly dehydrated with mild azotemia.   -We will follow neuro checks and serial troponin I's as well as check orthostatics. -We will follow repeat head CT scan without contrast. -Tight blood pressure control will be followed. -Aspirin will be stopped. -A neurosurgical consultation will be  obtained. -Dr. Lacinda Axon is aware about the patient. -Cardiology consult will be obtained given his arrhythmia and cardiac history and possibility of cardiogenic syncope. -Dr. Nehemiah Massed was notified about the patient  2.  Pulsus/ventricular bigeminy. -The patient will be monitored for arrhythmias that could be contributing to his syncope.  3.  Hypertension. -We will continue his Zestril and Coreg and place him on as needed IV hydralazine.  4.  Coronary artery disease. -We will continue statin therapy, Imdur as well as beta-blocker therapy with Coreg and stop aspirin.  5.  Dementia. -Aricept will be continued to be held for any altered mental status or sedation.  6.  Dyslipidemia.  -Statin therapy will be resumed.  7.  DVT prophylaxis. -SCDs. -Medical prophylaxis currently contraindicated due to his subdural hematoma.  8.  GI prophylaxis. -We will continue PPI therapy.    All the records are reviewed and case discussed with ED provider. The plan of care was discussed in details with the patient (and family). I answered all questions. The patient agreed to proceed with the above mentioned plan. Further management will depend upon hospital course.   CODE STATUS: This was discussed with the patient and he desires to be DNR/DNI.  He stated that he has a living well mentioning the back as well. Status is: Observation  The patient remains OBS appropriate and will d/c before 2 midnights.  Dispo: The patient is from: Home              Anticipated d/c is to: Home              Anticipated d/c date is: 1 day              Patient currently is not medically stable to d/c.   TOTAL TIME TAKING CARE OF THIS PATIENT: 55 minutes.    Christel Mormon M.D on 02/21/2020 at 7:14 PM  [facility]   From 7 PM-7 AM, contact night-coverage [email or web address]  CC: Primary care physician; Dion Body, MD   Note: This dictation was prepared with Dragon dictation along with smaller phrase  technology. Any transcriptional errors that result from this process are unintentional.

## 2020-02-21 NOTE — ED Triage Notes (Addendum)
Pt via EMS from home. Pt had a mechanical fall and fell backwards and hit his head on the concrete. EMS pt the pt on the monitor and pt was in a-fib 30-170bpm without a prior history. Pt also have a 2x2 laceration that is about an 1in deep on the back of his head. Pt is alert and oriented to only self and place. Pt has a hx of dementia. Pt in NAD a this time. Denies blood thinners. EMS gave 2.5 of metoprolol and 585ml of fluid.

## 2020-02-21 NOTE — ED Notes (Signed)
Pt transported to CT scan.

## 2020-02-21 NOTE — ED Provider Notes (Signed)
[facility] Emergency Department Provider Note   ____________________________________________   First MD Initiated Contact with Patient 02/21/20 1544     (approximate)  I have reviewed the triage vital signs and the nursing notes.   HISTORY  Chief Complaint Fall and Atrial Fibrillation    HPI [name] GOTTESMAN is a 86 y.o. male with possible history of CAD, CHF, SVT, and dementia who presents to the ED for syncope and fall.  Patient reports that he does not remember what happened and history is limited due to his baseline dementia.  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  Patient does not remember the fall, denies ever having any chest pain, shortness of breath, or palpitations.  On EMS arrival, patient noted to have laceration to the back of his head as well as tachycardia with a regular rhythm.  He was thought to be in atrial fibrillation with RVR and was given 2.5 mg of metoprolol.  On arrival to the ED, patient states that he feels well and is asymptomatic.        Past Medical History:  Diagnosis Date  . Fall at home 04/30/2019   Fall at home 2 weeks ago  . MI, old     Patient Active Problem List   Diagnosis Date Noted  . Subdural hemorrhage ([place]) 02/21/2020  . Chest pain 06/05/2018    Past Surgical History:  Procedure Laterality Date  . CORONARY ANGIOPLASTY WITH STENT PLACEMENT    . hemmorroid    . ROTATOR CUFF REPAIR      Prior to Admission medications   Medication Sig Start Date End Date Taking? Authorizing Provider  Lactobacillus Probiotic TABS Take by mouth.   Yes [provider]  acetaminophen (TYLENOL) 500 MG tablet Take 500 mg by mouth every 6 (six) hours as needed for mild pain.     [provider]  aspirin 81 MG tablet Take 81 mg by mouth daily.    [provider]  atorvastatin (LIPITOR) 20  MG tablet Take 20 mg by mouth daily.    [provider]  carvedilol (COREG) 3.125 MG tablet Take 3.125 mg by mouth 2 (two) times daily with a meal.    [provider]  donepezil (ARICEPT) 10 MG tablet Take 10 mg by mouth daily. 12/14/19   [provider]  isosorbide mononitrate (IMDUR) 30 MG 24 hr tablet Take 30 mg by mouth daily. 01/19/20   [provider]  lisinopril (PRINIVIL,ZESTRIL) 10 MG tablet Take 10 mg by mouth daily.    [provider]  nitroGLYCERIN (NITROSTAT) 0.4 MG SL tablet Place 1 tablet (0.4 mg total) under the tongue every 5 (five) minutes as needed for chest pain. 06/06/18   Bettey Costa, MD  omeprazole (PRILOSEC) 20 MG capsule Take 20 mg by mouth daily.    [provider]  spironolactone (ALDACTONE) 25 MG tablet Take 25 mg by mouth daily.    [provider]    Allergies Patient has no known allergies.  History reviewed. No pertinent family history.  Social History Social History   Tobacco Use  . Smoking status: Never Smoker  . Smokeless tobacco: Never Used  Substance Use Topics  . Alcohol use: No  . Drug use: Never    Review of Systems  Constitutional: No fever/chills Eyes: No visual changes. ENT: No sore throat. Cardiovascular: Denies chest pain.  Positive for  syncope. Respiratory: Denies shortness of breath. Gastrointestinal: No abdominal pain.  No nausea, no vomiting.  No diarrhea.  No constipation. Genitourinary: Negative for dysuria. Musculoskeletal: Negative for back pain. Skin: Negative for rash. Neurological: Negative for headaches, focal weakness or numbness.  ____________________________________________   PHYSICAL EXAM:  VITAL SIGNS: ED Triage Vitals  Enc Vitals Group     BP      Pulse      Resp      Temp      Temp src      SpO2      Weight      Height      Head Circumference      Peak Flow      Pain Score      Pain Loc      Pain Edu?      Excl. in [place]?      Constitutional: Alert and oriented. Eyes: Conjunctivae are normal. Head: 3 cm stellate laceration to occipital scalp. Nose: No congestion/rhinnorhea. Mouth/Throat: Mucous membranes are moist. Neck: Normal ROM Cardiovascular: Bradycardic, regular rhythm. Grossly normal heart sounds. Respiratory: Normal respiratory effort.  No retractions. Lungs CTAB. Gastrointestinal: Soft and nontender. No distention. Genitourinary: deferred Musculoskeletal: No lower extremity tenderness nor edema. Neurologic:  Normal speech and language. No gross focal neurologic deficits are appreciated. Skin:  Skin is warm, dry and intact. No rash noted. Psychiatric: Mood and affect are normal. Speech and behavior are normal.  ____________________________________________   LABS (all labs ordered are listed, but only abnormal results are displayed)  Labs Reviewed  CBC WITH DIFFERENTIAL/PLATELET - Abnormal; Notable for the following components:      Result Value   RBC 4.18 (*)    Platelets 133 (*)    All other components within normal limits  BASIC METABOLIC PANEL - Abnormal; Notable for the following components:   Glucose, Bld 111 (*)    BUN 26 (*)    Calcium 8.6 (*)    All other components within normal limits  SARS CORONAVIRUS 2 (TAT 6-24 HRS)  MAGNESIUM  COMPREHENSIVE METABOLIC PANEL  CBC  TROPONIN I (HIGH SENSITIVITY)  TROPONIN I (HIGH SENSITIVITY)   ____________________________________________  EKG  ED ECG REPORT I, Blake Divine, the attending physician, personally viewed and interpreted this ECG.   Date: 02/21/2020  EKG Time: 15:47  Rate: 86  Rhythm: Normal sinus rhythm with bigeminy  Axis: LAD  Intervals:nonspecific intraventricular conduction delay  ST&T Change: None   PROCEDURES  Procedure(s) performed (including Critical Care):  .Critical Care Performed by: Blake Divine, MD Authorized by: Blake Divine, MD   Critical care provider statement:    Critical care time  (minutes):  45   Critical care time was exclusive of:  Separately billable procedures and treating other patients and teaching time   Critical care was necessary to treat or prevent imminent or life-threatening deterioration of the following conditions:  Trauma   Critical care was time spent personally by me on the following activities:  Discussions with consultants, evaluation of patient's response to treatment, examination of patient, ordering and performing treatments and interventions, ordering and review of laboratory studies, ordering and review of radiographic studies, pulse oximetry, re-evaluation of patient's condition, obtaining history from patient or surrogate and review of old charts   I assumed direction of critical care for this patient from another provider in my specialty: no   .1-3 Lead EKG Interpretation Performed by: Blake Divine, MD Authorized by: Blake Divine, MD     Interpretation: normal  ECG rate:  74   ECG rate assessment: normal     Rhythm: sinus rhythm     Ectopy: bigeminy     Conduction: normal   ..Laceration Repair  Date/Time: 02/21/2020 8:26 PM Performed by: Blake Divine, MD Authorized by: Blake Divine, MD   Consent:    Consent obtained:  Verbal   Consent given by:  Patient and healthcare agent   Risks discussed:  Infection, pain, retained foreign body, poor cosmetic result and poor wound healing   Alternatives discussed:  No treatment Anesthesia (see MAR for exact dosages):    Anesthesia method:  Local infiltration   Local anesthetic:  Lidocaine 1% WITH epi Laceration details:    Location:  Scalp   Scalp location:  Occipital   Length (cm):  3 Repair type:    Repair type:  Simple Pre-procedure details:    Preparation:  Patient was prepped and draped in usual sterile fashion and imaging obtained to evaluate for foreign bodies Exploration:    Wound exploration: wound explored through full range of motion     Contaminated: no     Treatment:    Area cleansed with:  Saline   Amount of cleaning:  Standard   Irrigation solution:  Sterile saline   Irrigation method:  Syringe Skin repair:    Repair method:  Sutures   Suture size:  4-0   Suture material:  Nylon   Number of sutures:  3 Approximation:    Approximation:  Loose Post-procedure details:    Dressing:  Antibiotic ointment and tube gauze   Patient tolerance of procedure:  Tolerated well, no immediate complications     ____________________________________________   INITIAL IMPRESSION / ASSESSMENT AND PLAN / ED COURSE       86 year old male with history of CAD and CHF presents to the ED following syncopal episode where he fell backwards and struck his head on concrete.  He has a stellate laceration to his occipital scalp and we will further assess with CT head and C-spine.  Patient initially was thought to be in rapid A. fib by EMS and was given metoprolol, however upon arrival here in the ED he has ventricular bigeminy with occasional trigeminy, no ischemic changes noted.  He has had PVCs in the past, but never bigeminy and given his syncope today we will monitor closely.  Plan to check labs and electrolytes and he would likely require admission for monitoring on telemetry.  CT head is significant for 3 mm subdural hematoma, CT C-spine is negative.  Patient remains at his baseline mental status with no focal neurologic deficits.  Laceration to his posterior scalp was repaired and case discussed with Dr. Lacinda Axon of neurosurgery, who recommends repeat head CT in 6 hours and if this is stable patient will be appropriate for admission here at [facility].  If he has worsening subdural, then he will require transfer to trauma center.  Case discussed with hospitalist for admission for further syncope work-up.      ____________________________________________   FINAL CLINICAL IMPRESSION(S) / ED DIAGNOSES  Final diagnoses:  Syncope, unspecified syncope type   Ventricular bigeminy  Subdural hematoma [facility])     ED Discharge Orders    None       Note:  This document was prepared using Dragon voice recognition software and may include unintentional dictation errors.   Blake Divine, MD 02/21/20 2027

## 2020-02-21 NOTE — ED Notes (Signed)
Pt transported to CT ?

## 2020-02-21 NOTE — ED Notes (Addendum)
Pt resting comfortably at this time. ED stretcher locked and in lowest position. No complaints at this time. Son at bedside. Connected to cardiac monitor. V/S WNL. Respirations equal and unlabored. Call light within reach

## 2020-02-21 NOTE — ED Notes (Signed)
Pt laceration covered in bacitracin, applied gauze, and wrapped at this time.

## 2020-02-22 ENCOUNTER — Observation Stay: Payer: Medicare HMO

## 2020-02-22 DIAGNOSIS — I6200 Nontraumatic subdural hemorrhage, unspecified: Secondary | ICD-10-CM

## 2020-02-22 LAB — COMPREHENSIVE METABOLIC PANEL
ALT: 11 U/L (ref 0–44)
AST: 16 U/L (ref 15–41)
Albumin: 3.4 g/dL — ABNORMAL LOW (ref 3.5–5.0)
Alkaline Phosphatase: 50 U/L (ref 38–126)
Anion gap: 5 (ref 5–15)
BUN: 18 mg/dL (ref 8–23)
CO2: 25 mmol/L (ref 22–32)
Calcium: 8.1 mg/dL — ABNORMAL LOW (ref 8.9–10.3)
Chloride: 111 mmol/L (ref 98–111)
Creatinine, Ser: 0.82 mg/dL (ref 0.61–1.24)
GFR calc Af Amer: >60 mL/min (ref >60)
GFR calc non Af Amer: >60 mL/min (ref >60)
Glucose, Bld: 93 mg/dL (ref 70–99)
Potassium: 3.4 mmol/L — ABNORMAL LOW (ref 3.5–5.1)
Sodium: 141 mmol/L (ref 135–145)
Total Bilirubin: 1.1 mg/dL (ref 0.3–1.2)
Total Protein: 5.5 g/dL — ABNORMAL LOW (ref 6.5–8.1)

## 2020-02-22 LAB — GLUCOSE, CAPILLARY: Glucose-Capillary: 82 mg/dL (ref 70–99)

## 2020-02-22 LAB — SARS CORONAVIRUS 2 (TAT 6-24 HRS): SARS Coronavirus 2: NEGATIVE

## 2020-02-22 LAB — TROPONIN I (HIGH SENSITIVITY): Troponin I (High Sensitivity): 14 ng/L (ref <18)

## 2020-02-22 MED ORDER — POTASSIUM CHLORIDE CRYS ER 20 MEQ PO TBCR
40.0000 meq | EXTENDED_RELEASE_TABLET | Freq: Once | ORAL | Status: AC
  Administered 2020-02-22: 11:00:00 40 meq via ORAL
  Filled 2020-02-22: qty 2

## 2020-02-22 NOTE — ED Notes (Signed)
Notified attending Billie Ruddy that pt and family wish to speak with her since they were under the impression at some point that they were being d/c not admitted.

## 2020-02-22 NOTE — ED Notes (Signed)
Family updated. Awaiting d/c paperwork.

## 2020-02-22 NOTE — ED Notes (Signed)
Pt assisted to change into blue scrubs since his personal clothes were dirty per family. Wheeled out to car.

## 2020-02-22 NOTE — ED Notes (Signed)
Provider Billie Ruddy at bedside.

## 2020-02-22 NOTE — ED Notes (Signed)
Pt's family at bedside states pt wasn't interested in much on food tray at bedside. Pt's family updated on room assignment; states provider Nehemiah Massed said that he would review the chart and d/c the pt if everything looked okay. Told this RN will review notes and message the provider if needed to determine if plan is to admit or d/c since pt has bed assigned.

## 2020-02-22 NOTE — ED Notes (Signed)
Called ICU. Name and ascom number left.

## 2020-02-22 NOTE — ED Notes (Addendum)
note charted in error: wrong pt.

## 2020-02-22 NOTE — ED Notes (Signed)
Admit room changed from ICU 9 to ICU 3

## 2020-02-22 NOTE — Discharge Summary (Signed)
Physician Discharge Summary   [name]  male DOB: 18-Nov-1926  [id number]  PCP: Dion Body, MD  Admit date: 02/21/2020 Discharge date: 02/22/2020  Admitted From: home Disposition:  home CODE STATUS: DNR  Discharge Instructions    Diet - low sodium heart healthy   Complete by: As directed    Discharge instructions   Complete by: As directed    Dr. Nehemiah Massed had seen you in the hospital, and was ok with you going home and follow up with him as outpatient.  Continue to work with your outpatient physical therapy.  Please be sure to always have your assistance device when you walk, to avoid more falls.     Dr. Enzo Bi - -   Increase activity slowly   Complete by: As directed        Hospital Course:  For full details, please see H&P, progress notes, consult notes and ancillary notes.  Briefly,  [name]  is a 86 y.o. Caucasian male with a known history of coronary disease status post PCI and stent, who presented to the emergency room with acute onset of syncope while working in his yard with a fall and subsequent head injury.  The patient did not remember how he fell, however, per family, pt had been falling more frequently recently.  Pt did not have his walk assistant device when he fell.  He denied any headache or dizziness or blurred vision prior to his fall.  No reported chest pain or dyspnea or palpitations before or after his fall.  No reported paresthesias or focal muscle weakness.  He fell backward onto concrete floor and hit his head.    Upon EMS arrival, the patient was noted to have a laceration to the back of his head as well tachycardia.  He was thought to be in atrial fibrillation with RVR and was given 2.5 mg of IV Lopressor.  Upon presentation to the emergency room, blood pressure was 145/69 with otherwise normal vital signs.  Labs revealed BUN of 26 and creatinine 1.07 compared to 16/0.87 on 12/25/2019 high-sensitivity troponin I was  11. EKG showed normal sinus rhythm with a rate of 86 with ventricular bigeminy and intraventricular conduction delay and atypical right bundle branch block.  Noncontrasted head CT scan and C-spine CT revealed: 1. New tiny acute subdural hematoma along the anterior inferior right frontal lobe measuring up to 3 mm in maximal thickness. 2. Small posterior midline occipital scalp hematoma and laceration. 3. No acute cervical spine fracture or traumatic listhesis. Unchanged advanced multilevel cervical spondylosis.  The patient had a repair of an upper occipital laceration in the ER with 3 sutures with good hemostasis.  Syncope with subsequent fall, head injury and right frontal 3 mm subdural hematoma and occipital laceration status post repair Neurosurgery Dr. Lacinda Axon recommended having the patient observed and repeat head CT scan which was stable.  Pt's outpatient cardiologist Dr. Nehemiah Massed saw pt and recommended no further inpatient cardiac workup and will continue to follow for ventricular bigeminy and other sinus rhythm disturbances as outpatient.  Pt declined PT eval or any other hospital services and just wanted to go home.  Pt said he already has outpatient PT with which he follows regularly.  Hypertension. continued his Zestril and Coreg   Coronary artery disease. continued statin therapy, ASA, Imdur as well as beta-blocker therapy with Coreg   Dementia. Aricept resumed at discharge.  6.  Dyslipidemia.  Statin therapy continued   Discharge Diagnoses:  Active Problems:  Subdural hemorrhage [facility])    Discharge Instructions:  Allergies as of 02/22/2020   No Known Allergies     Medication List    TAKE these medications   acetaminophen 500 MG tablet Commonly known as: TYLENOL Take 500 mg by mouth every 6 (six) hours as needed for mild pain.   aspirin 81 MG tablet Take 81 mg by mouth daily.   atorvastatin 20 MG tablet Commonly known as: LIPITOR Take 20 mg by mouth  daily.   carvedilol 3.125 MG tablet Commonly known as: COREG Take 3.125 mg by mouth 2 (two) times daily with a meal.   donepezil 10 MG tablet Commonly known as: ARICEPT Take 10 mg by mouth daily.   isosorbide mononitrate 30 MG 24 hr tablet Commonly known as: IMDUR Take 30 mg by mouth daily.   Lactobacillus Probiotic Tabs Take by mouth.   lisinopril 10 MG tablet Commonly known as: ZESTRIL Take 10 mg by mouth daily.   nitroGLYCERIN 0.4 MG SL tablet Commonly known as: NITROSTAT Place 1 tablet (0.4 mg total) under the tongue every 5 (five) minutes as needed for chest pain.   omeprazole 20 MG capsule Commonly known as: PRILOSEC Take 20 mg by mouth daily.   spironolactone 25 MG tablet Commonly known as: ALDACTONE Take 25 mg by mouth daily.       Follow-up Information    Corey Skains, MD Follow up.   Specialty: Cardiology Contact information: [facility] [place] [address] [phone]        Dion Body, MD. Schedule an appointment as soon as possible for a visit in 1 week(s).   Specialty: Family Medicine Contact information: [place] [address] [phone]           No Known Allergies   The results of significant diagnostics from this hospitalization (including imaging, microbiology, ancillary and laboratory) are listed below for reference.   Consultations:   Procedures/Studies: CT HEAD WO CONTRAST  Result Date: 02/22/2020 CLINICAL DATA:  Follow-up subdural hematoma. EXAM: CT HEAD WITHOUT CONTRAST TECHNIQUE: Contiguous axial images were obtained from the base of the skull through the vertex without intravenous contrast. COMPARISON:  CT head without contrast 02/21/20 FINDINGS: Brain: No residual extra-axial hemorrhage is present anterior to the right frontal lobe. Minimal tentorial blood on the right is stable. Moderate generalized atrophy and diffuse  white matter disease is present. Acute infarct is present. Basal ganglia and insular ribbon is stable bilaterally. No new hemorrhage is present. The ventricles are proportionate to the degree of atrophy. Vascular: Atherosclerotic changes are present within the cavernous internal carotid arteries and at the dural margin the vertebral arteries. No hyperdense vessel is present. Skull: Right frontal scalp hematoma is again noted. No underlying fracture is present. Sinuses/Orbits: Fluid level is again seen in the right maxillary sinus. Mucosal thickening is present in the left maxillary sinus The paranasal sinuses and mastoid air cells are otherwise clear. Bilateral lens replacements are noted. Globes and orbits are otherwise unremarkable. IMPRESSION: 1. Stable blood along the tentorium. 2. No new hemorrhage. 3. Previously seen extra-axial hemorrhage at the anterior inferior right frontal lobe has resolved. 4. Stable advanced atrophy and white matter disease. This likely reflects the sequela of chronic microvascular ischemia. Electronically Signed   By: San Morelle M.D.   On: 02/22/2020 07:43   CT Head Wo Contrast  Result Date: 02/21/2020 CLINICAL DATA:  Known subdural hematoma, follow-up EXAM: CT HEAD WITHOUT CONTRAST TECHNIQUE:  Contiguous axial images were obtained from the base of the skull through the vertex without intravenous contrast. COMPARISON:  CT brain 02/21/2020, 12/25/2019 FINDINGS: Brain: No acute territorial infarction or intracranial mass is visualized. Again seen best on sagittal series is a tiny subdural hematoma along the anterior inferior frontal lobe measuring 2-3 mm maximum thickness, series 6, image number 19. Thin 3 mm subdural hematoma along the middle cranial fossa anteriorly, also without significant change. Slight asymmetric thickening of the right tentorium could also reflect small amount of subdural blood. No significant mass effect. Atrophy. Moderate hypodensity in the white  matter consistent with chronic small vessel ischemic change. Stable ventricle size Vascular: No hyperdense vessels. Vertebral and carotid vascular calcification Skull: Normal. Negative for fracture or focal lesion. Sinuses/Orbits: Mucosal thickening in the maxillary and ethmoid sinuses Other: Small right forehead hematoma. Posterior midline scalp laceration IMPRESSION: 1. Overall no significant interval change since the CT performed earlier today. A tiny right anterior inferior frontal lobe subdural hematoma measuring 2-3 mm is without change. Trace subdural hematoma within the anterior middle cranial fossa on the right and probable small amount of right tentorial blood are also not significantly changed. No midline shift. No significant mass effect 2. Atrophy and chronic small vessel ischemic change of the white matter. Electronically Signed   By: Donavan Foil M.D.   On: 02/21/2020 23:09   CT Head Wo Contrast  Result Date: 02/21/2020 CLINICAL DATA:  Fall. EXAM: CT HEAD WITHOUT CONTRAST CT CERVICAL SPINE WITHOUT CONTRAST TECHNIQUE: Multidetector CT imaging of the head and cervical spine was performed following the standard protocol without intravenous contrast. Multiplanar CT image reconstructions of the cervical spine were also generated. COMPARISON:  CT head and cervical spine dated December 25, 2019. FINDINGS: CT HEAD FINDINGS Brain: New tiny acute subdural hematoma along the anterior inferior right frontal lobe measuring up to 3 mm in maximal thickness (series 5, image 24). No evidence of acute infarction, hydrocephalus, or mass lesion/mass effect. Stable atrophy and chronic microvascular ischemic changes. Vascular: Calcified atherosclerosis at the skullbase. No hyperdense vessel. Skull: Normal. Negative for fracture or focal lesion. Sinuses/Orbits: No acute finding. Other: Small posterior midline occipital scalp hematoma and laceration. CT CERVICAL SPINE FINDINGS Alignment: No traumatic malalignment.  Chronic reversal of the normal cervical lordosis. Skull base and vertebrae: No acute fracture. No primary bone lesion or focal pathologic process. Soft tissues and spinal canal: No prevertebral fluid or swelling. No visible canal hematoma. Disc levels: Unchanged moderate to severe disc height loss and uncovertebral hypertrophy from C4-C5 through C7-T1. Unchanged moderate facet arthropathy at C2-C3 and C3-C4 with diffuse left C3-C4 facet joint. Upper chest: Mild centrilobular emphysema. Other: 1.1 cm right thyroid nodule, unchanged since 2008. Stability for greater than 5 years implies benignity; no biopsy or followup indicated. IMPRESSION: 1. New tiny acute subdural hematoma along the anterior inferior right frontal lobe measuring up to 3 mm in maximal thickness. 2. Small posterior midline occipital scalp hematoma and laceration. 3. No acute cervical spine fracture or traumatic listhesis. Unchanged advanced multilevel cervical spondylosis. Critical Value/emergent results were called by telephone at the time of interpretation on 02/21/2020 at 4:38 pm to provider [facility] , who verbally acknowledged these results. Electronically Signed   By: Titus Dubin M.D.   On: 02/21/2020 16:39   CT Cervical Spine Wo Contrast  Result Date: 02/21/2020 CLINICAL DATA:  Fall. EXAM: CT HEAD WITHOUT CONTRAST CT CERVICAL SPINE WITHOUT CONTRAST TECHNIQUE: Multidetector CT imaging of the head and cervical spine was performed  following the standard protocol without intravenous contrast. Multiplanar CT image reconstructions of the cervical spine were also generated. COMPARISON:  CT head and cervical spine dated December 25, 2019. FINDINGS: CT HEAD FINDINGS Brain: New tiny acute subdural hematoma along the anterior inferior right frontal lobe measuring up to 3 mm in maximal thickness (series 5, image 24). No evidence of acute infarction, hydrocephalus, or mass lesion/mass effect. Stable atrophy and chronic microvascular ischemic  changes. Vascular: Calcified atherosclerosis at the skullbase. No hyperdense vessel. Skull: Normal. Negative for fracture or focal lesion. Sinuses/Orbits: No acute finding. Other: Small posterior midline occipital scalp hematoma and laceration. CT CERVICAL SPINE FINDINGS Alignment: No traumatic malalignment. Chronic reversal of the normal cervical lordosis. Skull base and vertebrae: No acute fracture. No primary bone lesion or focal pathologic process. Soft tissues and spinal canal: No prevertebral fluid or swelling. No visible canal hematoma. Disc levels: Unchanged moderate to severe disc height loss and uncovertebral hypertrophy from C4-C5 through C7-T1. Unchanged moderate facet arthropathy at C2-C3 and C3-C4 with diffuse left C3-C4 facet joint. Upper chest: Mild centrilobular emphysema. Other: 1.1 cm right thyroid nodule, unchanged since 2008. Stability for greater than 5 years implies benignity; no biopsy or followup indicated. IMPRESSION: 1. New tiny acute subdural hematoma along the anterior inferior right frontal lobe measuring up to 3 mm in maximal thickness. 2. Small posterior midline occipital scalp hematoma and laceration. 3. No acute cervical spine fracture or traumatic listhesis. Unchanged advanced multilevel cervical spondylosis. Critical Value/emergent results were called by telephone at the time of interpretation on 02/21/2020 at 4:38 pm to provider [facility] , who verbally acknowledged these results. Electronically Signed   By: Titus Dubin M.D.   On: 02/21/2020 16:39      Labs: BNP (last 3 results) No results for input(s): BNP in the last 8760 hours. Basic Metabolic Panel: Recent Labs  Lab 02/21/20 1551 02/22/20 0553  NA 143 141  K 4.2 3.4*  CL 110 111  CO2 24 25  GLUCOSE 111* 93  BUN 26* 18  CREATININE 1.07 0.82  CALCIUM 8.6* 8.1*  MG 2.1  --    Liver Function Tests: Recent Labs  Lab 02/22/20 0553  AST 16  ALT 11  ALKPHOS 50  BILITOT 1.1  PROT 5.5*  ALBUMIN  3.4*   No results for input(s): LIPASE, AMYLASE in the last 168 hours. No results for input(s): AMMONIA in the last 168 hours. CBC: Recent Labs  Lab 02/21/20 1551  WBC 7.2  NEUTROABS 4.2  HGB 13.3  HCT 41.3  MCV 98.8  PLT 133*   Cardiac Enzymes: No results for input(s): CKTOTAL, CKMB, CKMBINDEX, TROPONINI in the last 168 hours. BNP: Invalid input(s): POCBNP CBG: Recent Labs  Lab 02/22/20 0553  GLUCAP 82   D-Dimer No results for input(s): DDIMER in the last 72 hours. Hgb A1c No results for input(s): HGBA1C in the last 72 hours. Lipid Profile No results for input(s): CHOL, HDL, LDLCALC, TRIG, CHOLHDL, LDLDIRECT in the last 72 hours. Thyroid function studies No results for input(s): TSH, T4TOTAL, T3FREE, THYROIDAB in the last 72 hours.  Invalid input(s): FREET3 Anemia work up No results for input(s): VITAMINB12, FOLATE, FERRITIN, TIBC, IRON, RETICCTPCT in the last 72 hours. Urinalysis    Component Value Date/Time   COLORURINE YELLOW (A) 12/25/2019 2115   APPEARANCEUR CLEAR (A) 12/25/2019 2115   LABSPEC 1.012 12/25/2019 2115   PHURINE 5.0 12/25/2019 2115   GLUCOSEU NEGATIVE 12/25/2019 2115   HGBUR NEGATIVE 12/25/2019 2115   BILIRUBINUR NEGATIVE  12/25/2019 2115   [place] NEGATIVE 12/25/2019 2115   PROTEINUR NEGATIVE 12/25/2019 2115   NITRITE NEGATIVE 12/25/2019 2115   LEUKOCYTESUR NEGATIVE 12/25/2019 2115   Sepsis Labs Invalid input(s): PROCALCITONIN,  WBC,  LACTICIDVEN Microbiology Recent Results (from the past 240 hour(s))  SARS CORONAVIRUS 2 (TAT 6-24 HRS) Nasopharyngeal Nasopharyngeal Swab     Status: None   Collection Time: 02/21/20  8:07 PM   Specimen: Nasopharyngeal Swab  Result Value Ref Range Status   SARS Coronavirus 2 NEGATIVE NEGATIVE Final    Comment: (NOTE) SARS-CoV-2 target nucleic acids are NOT DETECTED. The SARS-CoV-2 RNA is generally detectable in upper and lower respiratory specimens during the acute phase of infection. Negative results  do not preclude SARS-CoV-2 infection, do not rule out co-infections with other pathogens, and should not be used as the sole basis for treatment or other patient management decisions. Negative results must be combined with clinical observations, patient history, and epidemiological information. The expected result is Negative. Fact Sheet for Patients: SugarRoll.be Fact Sheet for Healthcare Providers: https://www.woods-mathews.com/ This test is not yet approved or cleared by the [place] FDA and  has been authorized for detection and/or diagnosis of SARS-CoV-2 by FDA under an Emergency Use Authorization (EUA). This EUA will remain  in effect (meaning this test can be used) for the duration of the COVID-19 declaration under Section 56 4(b)(1) of the Act, 21 U.S.C. section 360bbb-3(b)(1), unless the authorization is terminated or revoked sooner. Performed at [facility], [place] [address] [place], [place] [address]      Total time spend on discharging this patient, including the last patient exam, discussing the hospital stay, instructions for ongoing care as it relates to all pertinent caregivers, as well as preparing the medical discharge records, prescriptions, and/or referrals as applicable, is 50 minutes.    Enzo Bi, MD  [facility] 02/22/2020, 2:19 PM  If 7PM-7AM, please contact night-coverage

## 2020-02-22 NOTE — ED Notes (Signed)
Ready bed @ 1328, patient going to room ICU 09

## 2020-02-22 NOTE — Consult Note (Signed)
[facility] Consultation Note  Patient ID: [name], MRN: [id number], DOB/AGE: January 01, 1927 86 y.o. Admit date: 02/21/2020   Date of Consult: 02/22/2020 Primary Physician: Dion Body, MD Primary Cardiologist: Nehemiah Massed  Chief Complaint:  Chief Complaint  Patient presents with  . Fall  . Atrial Fibrillation   Reason for Consult: Fall  HPI: 86 y.o. male with known coronary artery disease status post previous PCI and stent placement with known ventricular bigeminy hyperlipidemia hypertension and chronic systolic dysfunction congestive heart failure on appropriate medication management doing relatively well until the he was out in the front yard and fell over a small ledge of concrete and hit his head.  The patient was seen in the emergency room for which the patient has not had any apparent neurologic abnormalities but has had a CAT scan showing no evidence of intracranial bleed.  During his hospitalization the patient has had EKG showing ventricular bigeminy for lots of time but a heart rate about 67 bpm.  This is a known abnormality that has been followed for quite some time and is multifactorial in nature.  With his medication it does not appear that he had a true syncopal episode or loss consciousness.  The patient currently feels well and no evidence of anginal symptoms or acute on chronic systolic dysfunction heart failure either.  He is hemodynamically stable and after long discussion with the family it does appear that he may be able to go home Past Medical History:  Diagnosis Date  . Fall at home 04/30/2019   Fall at home 2 weeks ago  . MI, old       Surgical History:  Past Surgical History:  Procedure Laterality Date  . CORONARY ANGIOPLASTY WITH STENT PLACEMENT    . hemmorroid    . ROTATOR CUFF REPAIR       Home Meds: Prior to Admission medications   Medication Sig Start Date End Date Taking? Authorizing Provider  acetaminophen (TYLENOL) 500 MG  tablet Take 500 mg by mouth every 6 (six) hours as needed for mild pain.    Yes [provider]  aspirin 81 MG tablet Take 81 mg by mouth daily.   Yes [provider]  atorvastatin (LIPITOR) 20 MG tablet Take 20 mg by mouth daily.   Yes [provider]  carvedilol (COREG) 3.125 MG tablet Take 3.125 mg by mouth 2 (two) times daily with a meal.   Yes [provider]  donepezil (ARICEPT) 10 MG tablet Take 10 mg by mouth daily. 12/14/19  Yes [provider]  isosorbide mononitrate (IMDUR) 30 MG 24 hr tablet Take 30 mg by mouth daily. 01/19/20  Yes [provider]  Lactobacillus Probiotic TABS Take by mouth.   Yes [provider]  lisinopril (PRINIVIL,ZESTRIL) 10 MG tablet Take 10 mg by mouth daily.   Yes [provider]  nitroGLYCERIN (NITROSTAT) 0.4 MG SL tablet Place 1 tablet (0.4 mg total) under the tongue every 5 (five) minutes as needed for chest pain. 06/06/18  Yes Mody, Ulice Bold, MD  omeprazole (PRILOSEC) 20 MG capsule Take 20 mg by mouth daily.   Yes [provider]  spironolactone (ALDACTONE) 25 MG tablet Take 25 mg by mouth daily.   Yes [provider]    Inpatient Medications:  . atorvastatin  20 mg Oral Daily  . carvedilol  3.125 mg Oral BID WC  . lisinopril  10 mg Oral Daily  . pantoprazole  40 mg Oral Daily  . sodium chloride  flush  3 mL Intravenous Q12H  . spironolactone  25 mg Oral Daily   . sodium chloride 100 mL/hr at 02/22/20 [id number]    Allergies: No Known Allergies  Social History   Socioeconomic History  . Marital status: Married    Spouse name: Not on file  . Number of children: Not on file  . Years of education: Not on file  . Highest education level: Not on file  Occupational History  . Not on file  Tobacco Use  . Smoking status: Never Smoker  . Smokeless tobacco: Never Used  Substance and Sexual Activity  . Alcohol use: No  . Drug use: Never  . Sexual activity: Not on file   Other Topics Concern  . Not on file  Social History Narrative  . Not on file   Social Determinants of Health   Financial Resource Strain:   . Difficulty of Paying Living Expenses:   Food Insecurity:   . Worried About Charity fundraiser in the Last Year:   . Arboriculturist in the Last Year:   Transportation Needs:   . Film/video editor (Medical):   Marland Kitchen Lack of Transportation (Non-Medical):   Physical Activity:   . Days of Exercise per Week:   . Minutes of Exercise per Session:   Stress:   . Feeling of Stress :   Social Connections:   . Frequency of Communication with Friends and Family:   . Frequency of Social Gatherings with Friends and Family:   . Attends Religious Services:   . Active Member of Clubs or Organizations:   . Attends Archivist Meetings:   Marland Kitchen Marital Status:   Intimate Partner Violence:   . Fear of Current or Ex-Partner:   . Emotionally Abused:   Marland Kitchen Physically Abused:   . Sexually Abused:      History reviewed. No pertinent family history.   Review of Systems Positive for fall Negative for: General:  chills, fever, night sweats or weight changes.  Cardiovascular: PND orthopnea syncope dizziness  Dermatological skin lesions rashes Respiratory: Cough congestion Urologic: Frequent urination urination at night and hematuria Abdominal: negative for nausea, vomiting, diarrhea, bright red blood per rectum, melena, or hematemesis Neurologic: negative for visual changes, and/or hearing changes  All other systems reviewed and are otherwise negative except as noted above.  Labs: No results for input(s): CKTOTAL, CKMB, TROPONINI in the last 72 hours. Lab Results  Component Value Date   WBC 7.2 02/21/2020   HGB 13.3 02/21/2020   HCT 41.3 02/21/2020   MCV 98.8 02/21/2020   PLT 133 (L) 02/21/2020    Recent Labs  Lab 02/22/20 0553  NA 141  K 3.4*  CL 111  CO2 25  BUN 18  CREATININE 0.82  CALCIUM 8.1*  PROT 5.5*  BILITOT 1.1  ALKPHOS  50  ALT 11  AST 16  GLUCOSE 93   No results found for: CHOL, HDL, LDLCALC, TRIG No results found for: DDIMER  Radiology/Studies:  CT HEAD WO CONTRAST  Result Date: 02/22/2020 CLINICAL DATA:  Follow-up subdural hematoma. EXAM: CT HEAD WITHOUT CONTRAST TECHNIQUE: Contiguous axial images were obtained from the base of the skull through the vertex without intravenous contrast. COMPARISON:  CT head without contrast 02/21/20 FINDINGS: Brain: No residual extra-axial hemorrhage is present anterior to the right frontal lobe. Minimal tentorial blood on the right is stable. Moderate generalized atrophy and diffuse white matter disease is present. Acute infarct is present. Basal ganglia and insular ribbon  is stable bilaterally. No new hemorrhage is present. The ventricles are proportionate to the degree of atrophy. Vascular: Atherosclerotic changes are present within the cavernous internal carotid arteries and at the dural margin the vertebral arteries. No hyperdense vessel is present. Skull: Right frontal scalp hematoma is again noted. No underlying fracture is present. Sinuses/Orbits: Fluid level is again seen in the right maxillary sinus. Mucosal thickening is present in the left maxillary sinus The paranasal sinuses and mastoid air cells are otherwise clear. Bilateral lens replacements are noted. Globes and orbits are otherwise unremarkable. IMPRESSION: 1. Stable blood along the tentorium. 2. No new hemorrhage. 3. Previously seen extra-axial hemorrhage at the anterior inferior right frontal lobe has resolved. 4. Stable advanced atrophy and white matter disease. This likely reflects the sequela of chronic microvascular ischemia. Electronically Signed   By: San Morelle M.D.   On: 02/22/2020 07:43   CT Head Wo Contrast  Result Date: 02/21/2020 CLINICAL DATA:  Known subdural hematoma, follow-up EXAM: CT HEAD WITHOUT CONTRAST TECHNIQUE: Contiguous axial images were obtained from the base of the skull  through the vertex without intravenous contrast. COMPARISON:  CT brain 02/21/2020, 12/25/2019 FINDINGS: Brain: No acute territorial infarction or intracranial mass is visualized. Again seen best on sagittal series is a tiny subdural hematoma along the anterior inferior frontal lobe measuring 2-3 mm maximum thickness, series 6, image number 19. Thin 3 mm subdural hematoma along the middle cranial fossa anteriorly, also without significant change. Slight asymmetric thickening of the right tentorium could also reflect small amount of subdural blood. No significant mass effect. Atrophy. Moderate hypodensity in the white matter consistent with chronic small vessel ischemic change. Stable ventricle size Vascular: No hyperdense vessels. Vertebral and carotid vascular calcification Skull: Normal. Negative for fracture or focal lesion. Sinuses/Orbits: Mucosal thickening in the maxillary and ethmoid sinuses Other: Small right forehead hematoma. Posterior midline scalp laceration IMPRESSION: 1. Overall no significant interval change since the CT performed earlier today. A tiny right anterior inferior frontal lobe subdural hematoma measuring 2-3 mm is without change. Trace subdural hematoma within the anterior middle cranial fossa on the right and probable small amount of right tentorial blood are also not significantly changed. No midline shift. No significant mass effect 2. Atrophy and chronic small vessel ischemic change of the white matter. Electronically Signed   By: Donavan Foil M.D.   On: 02/21/2020 23:09   CT Head Wo Contrast  Result Date: 02/21/2020 CLINICAL DATA:  Fall. EXAM: CT HEAD WITHOUT CONTRAST CT CERVICAL SPINE WITHOUT CONTRAST TECHNIQUE: Multidetector CT imaging of the head and cervical spine was performed following the standard protocol without intravenous contrast. Multiplanar CT image reconstructions of the cervical spine were also generated. COMPARISON:  CT head and cervical spine dated December 25, 2019. FINDINGS: CT HEAD FINDINGS Brain: New tiny acute subdural hematoma along the anterior inferior right frontal lobe measuring up to 3 mm in maximal thickness (series 5, image 24). No evidence of acute infarction, hydrocephalus, or mass lesion/mass effect. Stable atrophy and chronic microvascular ischemic changes. Vascular: Calcified atherosclerosis at the skullbase. No hyperdense vessel. Skull: Normal. Negative for fracture or focal lesion. Sinuses/Orbits: No acute finding. Other: Small posterior midline occipital scalp hematoma and laceration. CT CERVICAL SPINE FINDINGS Alignment: No traumatic malalignment. Chronic reversal of the normal cervical lordosis. Skull base and vertebrae: No acute fracture. No primary bone lesion or focal pathologic process. Soft tissues and spinal canal: No prevertebral fluid or swelling. No visible canal hematoma. Disc levels: Unchanged moderate to severe  disc height loss and uncovertebral hypertrophy from C4-C5 through C7-T1. Unchanged moderate facet arthropathy at C2-C3 and C3-C4 with diffuse left C3-C4 facet joint. Upper chest: Mild centrilobular emphysema. Other: 1.1 cm right thyroid nodule, unchanged since 2008. Stability for greater than 5 years implies benignity; no biopsy or followup indicated. IMPRESSION: 1. New tiny acute subdural hematoma along the anterior inferior right frontal lobe measuring up to 3 mm in maximal thickness. 2. Small posterior midline occipital scalp hematoma and laceration. 3. No acute cervical spine fracture or traumatic listhesis. Unchanged advanced multilevel cervical spondylosis. Critical Value/emergent results were called by telephone at the time of interpretation on 02/21/2020 at 4:38 pm to provider [facility] , who verbally acknowledged these results. Electronically Signed   By: Titus Dubin M.D.   On: 02/21/2020 16:39   CT Cervical Spine Wo Contrast  Result Date: 02/21/2020 CLINICAL DATA:  Fall. EXAM: CT HEAD WITHOUT CONTRAST CT  CERVICAL SPINE WITHOUT CONTRAST TECHNIQUE: Multidetector CT imaging of the head and cervical spine was performed following the standard protocol without intravenous contrast. Multiplanar CT image reconstructions of the cervical spine were also generated. COMPARISON:  CT head and cervical spine dated December 25, 2019. FINDINGS: CT HEAD FINDINGS Brain: New tiny acute subdural hematoma along the anterior inferior right frontal lobe measuring up to 3 mm in maximal thickness (series 5, image 24). No evidence of acute infarction, hydrocephalus, or mass lesion/mass effect. Stable atrophy and chronic microvascular ischemic changes. Vascular: Calcified atherosclerosis at the skullbase. No hyperdense vessel. Skull: Normal. Negative for fracture or focal lesion. Sinuses/Orbits: No acute finding. Other: Small posterior midline occipital scalp hematoma and laceration. CT CERVICAL SPINE FINDINGS Alignment: No traumatic malalignment. Chronic reversal of the normal cervical lordosis. Skull base and vertebrae: No acute fracture. No primary bone lesion or focal pathologic process. Soft tissues and spinal canal: No prevertebral fluid or swelling. No visible canal hematoma. Disc levels: Unchanged moderate to severe disc height loss and uncovertebral hypertrophy from C4-C5 through C7-T1. Unchanged moderate facet arthropathy at C2-C3 and C3-C4 with diffuse left C3-C4 facet joint. Upper chest: Mild centrilobular emphysema. Other: 1.1 cm right thyroid nodule, unchanged since 2008. Stability for greater than 5 years implies benignity; no biopsy or followup indicated. IMPRESSION: 1. New tiny acute subdural hematoma along the anterior inferior right frontal lobe measuring up to 3 mm in maximal thickness. 2. Small posterior midline occipital scalp hematoma and laceration. 3. No acute cervical spine fracture or traumatic listhesis. Unchanged advanced multilevel cervical spondylosis. Critical Value/emergent results were called by telephone at  the time of interpretation on 02/21/2020 at 4:38 pm to provider [facility] , who verbally acknowledged these results. Electronically Signed   By: Titus Dubin M.D.   On: 02/21/2020 16:39    EKG: Normal sinus rhythm with ventricular bigeminy  Weights: Filed Weights   02/21/20 1548  Weight: 87.1 kg     Physical Exam: Blood pressure (!) 143/60, pulse 60, temperature 98.4 F (36.9 C), temperature source Oral, resp. rate (!) 23, height 6' (1.829 m), weight 87.1 kg, SpO2 95 %. Body mass index is 26.04 kg/m. General: Well developed, well nourished, in no acute distress. Head eyes ears nose throat: Normocephalic, atraumatic, sclera non-icteric, no xanthomas, nares are without discharge. No apparent thyromegaly and/or mass  Lungs: Normal respiratory effort.  no wheezes, no rales, no rhonchi.  Heart: RRR with normal S1 S2. no murmur gallop, no rub, PMI is normal size and placement, carotid upstroke normal without bruit, jugular venous pressure is normal Abdomen:  Soft, non-tender, non-distended with normoactive bowel sounds. No hepatomegaly. No rebound/guarding. No obvious abdominal masses. Abdominal aorta is normal size without bruit Extremities: No edema. no cyanosis, no clubbing, no ulcers  Peripheral : 2+ bilateral upper extremity pulses, 2+ bilateral femoral pulses, 2+ bilateral dorsal pedal pulse Neuro: Alert and oriented. No facial asymmetry. No focal deficit. Moves all extremities spontaneously. Musculoskeletal: Normal muscle tone without kyphosis Psych:  Responds to questions appropriately with a normal affect.    Assessment: 86 year old male with chronic systolic dysfunction heart failure hypertension hyperlipidemia coronary artery disease with a fall and an abnormal EKG showing ventricular bigeminy unchanged from before without evidence of historic true syncope  Plan: 1.  No further cardiac diagnostics and/or cardiac work-up at this time due to no evidence of congestive heart  failure or myocardial infarction or true syncope 2.  Continue to follow for ventricular bigeminy and other sinus rhythm disturbances with outpatient monitor 3.  Reinstatement of all medication management for congestive heart failure coronary artery disease and hyperlipidemia as before and with readjustments as necessary as an outpatient 4.  Continue ambulation in the hospital and follow for improvements of symptoms and okay for discharge home from cardiac standpoint  Signed, Corey Skains M.D. [facility] 02/22/2020, 1:22 PM

## 2020-02-22 NOTE — Consult Note (Signed)
Neurosurgery-New Consultation Evaluation 02/22/2020 WILBERTH CARONNA [id number]  Identifying Statement: [name] is a 86 y.o. male from [place] [id number] with fall  Physician Requesting Consultation: [place] [facility]  History of Present Illness: [name] is brought to the emergency department for concern for falls at home today where he did hit the back of his head.  He does not report any changes prior to this but reportedly he was working in the yard without his assistive device.  He was witnessed to have multiple falls.  In the emergency department, he was noted to be at his neurologic baseline.  CT scan of the head was obtained which did reveal a very small right frontal acute blood collection.  There was no mass-effect.  He is on no antiplatelet or anticoagulant therapy.  His son is with him today in the room.  Past Medical History:  Past Medical History:  Diagnosis Date  . Fall at home 04/30/2019   Fall at home 2 weeks ago  . MI, old     Social History: Social History   Socioeconomic History  . Marital status: Married    Spouse name: Not on file  . Number of children: Not on file  . Years of education: Not on file  . Highest education level: Not on file  Occupational History  . Not on file  Tobacco Use  . Smoking status: Never Smoker  . Smokeless tobacco: Never Used  Substance and Sexual Activity  . Alcohol use: No  . Drug use: Never  . Sexual activity: Not on file  Other Topics Concern  . Not on file  Social History Narrative  . Not on file   Social Determinants of Health   Financial Resource Strain:   . Difficulty of Paying Living Expenses:   Food Insecurity:   . Worried About Charity fundraiser in the Last Year:   . Arboriculturist in the Last Year:   Transportation Needs:   . Film/video editor (Medical):   Marland Kitchen Lack of Transportation (Non-Medical):   Physical Activity:   . Days of Exercise per Week:   . Minutes of  Exercise per Session:   Stress:   . Feeling of Stress :   Social Connections:   . Frequency of Communication with Friends and Family:   . Frequency of Social Gatherings with Friends and Family:   . Attends Religious Services:   . Active Member of Clubs or Organizations:   . Attends Archivist Meetings:   Marland Kitchen Marital Status:   Intimate Partner Violence:   . Fear of Current or Ex-Partner:   . Emotionally Abused:   Marland Kitchen Physically Abused:   . Sexually Abused:     Family History: History reviewed. No pertinent family history.  Review of Systems:  Review of Systems - General ROS: Negative Psychological ROS: Negative Ophthalmic ROS: Negative ENT ROS: Negative Hematological and Lymphatic ROS: Negative  Endocrine ROS: Negative Respiratory ROS: Negative Cardiovascular ROS: Negative Gastrointestinal ROS: Negative Genito-Urinary ROS: Negative Musculoskeletal ROS: Negative Neurological ROS: Negative for headache Dermatological ROS: Negative  Physical Exam: BP 138/73 (BP Location: Right Arm)   Pulse 65   Temp 98.4 F (36.9 C) (Oral)   Resp 20   Ht 6' (1.829 m)   Wt 87.1 kg   SpO2 96%   BMI 26.04 kg/m  Body mass index is 26.04 kg/m. Body surface area is 2.1 meters squared. General appearance: Alert, cooperative, in no acute distress Head:  Normocephalic Eyes: Normal, EOM intact Oropharynx: Wearing facemask Ext: No edema in extremities  Neurologic exam:  Mental status: alertness: alert, orientation: person but did not know the place, affect: normal Speech: fluent and clear Cranial nerves:  II: Visual fields are full by confrontation, no ptosis III/IV/VI: extra-ocular motions intact bilaterally V/VII:no evidence of facial droop or weakness  VIII: Hard of hearing Motor:strength symmetric 5/5, normal muscle mass and tone in all extremities  Sensory: intact to light touch in all extremities Gait: Not tested  Laboratory: Results for orders placed or performed during  the hospital encounter of 02/21/20  SARS CORONAVIRUS 2 (TAT 6-24 HRS) Nasopharyngeal Nasopharyngeal Swab   Specimen: Nasopharyngeal Swab  Result Value Ref Range   SARS Coronavirus 2 NEGATIVE NEGATIVE  CBC with Differential  Result Value Ref Range   WBC 7.2 4.0 - 10.5 K/uL   RBC 4.18 (L) 4.22 - 5.81 MIL/uL   Hemoglobin 13.3 13.0 - 17.0 g/dL   HCT 41.3 39.0 - 52.0 %   MCV 98.8 80.0 - 100.0 fL   MCH 31.8 26.0 - 34.0 pg   MCHC 32.2 30.0 - 36.0 g/dL   RDW 13.8 11.5 - 15.5 %   Platelets 133 (L) 150 - 400 K/uL   nRBC 0.0 0.0 - 0.2 %   Neutrophils Relative % 58 %   Neutro Abs 4.2 1.7 - 7.7 K/uL   Lymphocytes Relative 24 %   Lymphs Abs 1.7 0.7 - 4.0 K/uL   Monocytes Relative 11 %   Monocytes Absolute 0.8 0.1 - 1.0 K/uL   Eosinophils Relative 5 %   Eosinophils Absolute 0.4 0.0 - 0.5 K/uL   Basophils Relative 1 %   Basophils Absolute 0.1 0.0 - 0.1 K/uL   Immature Granulocytes 1 %   Abs Immature Granulocytes 0.04 0.00 - 0.07 K/uL  Basic metabolic panel  Result Value Ref Range   Sodium 143 135 - 145 mmol/L   Potassium 4.2 3.5 - 5.1 mmol/L   Chloride 110 98 - 111 mmol/L   CO2 24 22 - 32 mmol/L   Glucose, Bld 111 (H) 70 - 99 mg/dL   BUN 26 (H) 8 - 23 mg/dL   Creatinine, Ser 1.07 0.61 - 1.24 mg/dL   Calcium 8.6 (L) 8.9 - 10.3 mg/dL   GFR calc non Af Amer >60 >60 mL/min   GFR calc Af Amer >60 >60 mL/min   Anion gap 9 5 - 15  Magnesium  Result Value Ref Range   Magnesium 2.1 1.7 - 2.4 mg/dL  Comprehensive metabolic panel  Result Value Ref Range   Sodium 141 135 - 145 mmol/L   Potassium 3.4 (L) 3.5 - 5.1 mmol/L   Chloride 111 98 - 111 mmol/L   CO2 25 22 - 32 mmol/L   Glucose, Bld 93 70 - 99 mg/dL   BUN 18 8 - 23 mg/dL   Creatinine, Ser 0.82 0.61 - 1.24 mg/dL   Calcium 8.1 (L) 8.9 - 10.3 mg/dL   Total Protein 5.5 (L) 6.5 - 8.1 g/dL   Albumin 3.4 (L) 3.5 - 5.0 g/dL   AST 16 15 - 41 U/L   ALT 11 0 - 44 U/L   Alkaline Phosphatase 50 38 - 126 U/L   Total Bilirubin 1.1 0.3 - 1.2  mg/dL   GFR calc non Af Amer >60 >60 mL/min   GFR calc Af Amer >60 >60 mL/min   Anion gap 5 5 - 15  Glucose, capillary  Result Value Ref Range  Glucose-Capillary 82 70 - 99 mg/dL  Troponin I (High Sensitivity)  Result Value Ref Range   Troponin I (High Sensitivity) 11 <18 ng/L  Troponin I (High Sensitivity)  Result Value Ref Range   Troponin I (High Sensitivity) 12 <18 ng/L  Troponin I (High Sensitivity)  Result Value Ref Range   Troponin I (High Sensitivity) 14 <18 ng/L   I personally reviewed labs  Imaging: CT head: 1. Stable blood along the tentorium. 2. No new hemorrhage. 3. Previously seen extra-axial hemorrhage at the anterior inferior right frontal lobe has resolved. 4. Stable advanced atrophy and white matter disease. This likely reflects the sequela of chronic microvascular ischemia.   Impression/Plan:  [name] is here for evaluation of a small amount of acute hemorrhage after a fall.  We do have a repeat CT scan at this time which shows resolution of the previously seen right frontal blood.  Given that he is at no increased risk for bleeding incident, I do not think any further imaging is needed.  We did discuss avoidance of falls and the medicine team is currently working up other possible causes.  Given the updated CT findings, no surgical intervention is recommended.  He is okay to discharge from a neurosurgical perspective and no follow-up as needed for the CT of the head.   1.  Diagnosis: Small subdural hematoma  2.  Plan -Resolution on repeat CT is seen, no surgical intervention

## 2020-02-22 NOTE — ED Notes (Signed)
Provider [name] as pt has assigned bed to ICU based on situation/CT results but family at bedside determined to have pt d/c. Not requesting to leave AMA yet but remains anxious to have pt go home. Educated.

## 2020-02-22 NOTE — ED Notes (Signed)
Per provider Nehemiah Massed and Billie Ruddy pt being d/c home. Family updated. Pt repositioned in bed. Will d/c once paperwork ready.

## 2020-02-22 NOTE — ED Notes (Signed)
Provider kowalski at bedside.

## 2020-02-22 NOTE — ED Notes (Signed)
Pt alert and resting calmly in bed. Family remains at bedside. Bed locked low. Rails up. Call bell within reach.

## 2020-02-22 NOTE — ED Notes (Signed)
About 600cc of urine noted in canister from male purewick device.

## 2020-02-23 DIAGNOSIS — R55 Syncope and collapse: Secondary | ICD-10-CM | POA: Diagnosis not present

## 2020-03-02 DIAGNOSIS — H353221 Exudative age-related macular degeneration, left eye, with active choroidal neovascularization: Secondary | ICD-10-CM | POA: Diagnosis not present

## 2020-03-08 DIAGNOSIS — E7800 Pure hypercholesterolemia, unspecified: Secondary | ICD-10-CM | POA: Diagnosis not present

## 2020-03-08 DIAGNOSIS — I10 Essential (primary) hypertension: Secondary | ICD-10-CM | POA: Diagnosis not present

## 2020-03-08 DIAGNOSIS — I471 Supraventricular tachycardia: Secondary | ICD-10-CM | POA: Diagnosis not present

## 2020-03-08 DIAGNOSIS — I2510 Atherosclerotic heart disease of native coronary artery without angina pectoris: Secondary | ICD-10-CM | POA: Diagnosis not present

## 2020-03-08 DIAGNOSIS — I5022 Chronic systolic (congestive) heart failure: Secondary | ICD-10-CM | POA: Diagnosis not present

## 2020-03-21 DIAGNOSIS — E7800 Pure hypercholesterolemia, unspecified: Secondary | ICD-10-CM | POA: Diagnosis not present

## 2020-03-28 DIAGNOSIS — Z0000 Encounter for general adult medical examination without abnormal findings: Secondary | ICD-10-CM | POA: Diagnosis not present

## 2020-03-28 DIAGNOSIS — Z79899 Other long term (current) drug therapy: Secondary | ICD-10-CM | POA: Diagnosis not present

## 2020-03-28 DIAGNOSIS — G301 Alzheimer's disease with late onset: Secondary | ICD-10-CM | POA: Diagnosis not present

## 2020-03-28 DIAGNOSIS — F0280 Dementia in other diseases classified elsewhere without behavioral disturbance: Secondary | ICD-10-CM | POA: Diagnosis not present

## 2020-03-28 DIAGNOSIS — I10 Essential (primary) hypertension: Secondary | ICD-10-CM | POA: Diagnosis not present

## 2020-03-28 DIAGNOSIS — I2510 Atherosclerotic heart disease of native coronary artery without angina pectoris: Secondary | ICD-10-CM | POA: Diagnosis not present

## 2020-03-28 DIAGNOSIS — E7800 Pure hypercholesterolemia, unspecified: Secondary | ICD-10-CM | POA: Diagnosis not present

## 2020-04-12 ENCOUNTER — Encounter: Payer: Self-pay | Admitting: *Deleted

## 2020-04-12 ENCOUNTER — Emergency Department: Payer: Medicare HMO

## 2020-04-12 ENCOUNTER — Other Ambulatory Visit: Payer: Self-pay

## 2020-04-12 ENCOUNTER — Emergency Department
Admission: EM | Admit: 2020-04-12 | Discharge: 2020-04-12 | Disposition: A | Discharge status: Home / Self Care | Payer: Medicare HMO | Attending: Emergency Medicine | Admitting: Emergency Medicine

## 2020-04-12 DIAGNOSIS — S299XXA Unspecified injury of thorax, initial encounter: Secondary | ICD-10-CM | POA: Diagnosis not present

## 2020-04-12 DIAGNOSIS — W01198A Fall on same level from slipping, tripping and stumbling with subsequent striking against other object, initial encounter: Secondary | ICD-10-CM | POA: Diagnosis not present

## 2020-04-12 DIAGNOSIS — Y999 Unspecified external cause status: Secondary | ICD-10-CM | POA: Diagnosis not present

## 2020-04-12 DIAGNOSIS — Y92009 Unspecified place in unspecified non-institutional (private) residence as the place of occurrence of the external cause: Secondary | ICD-10-CM | POA: Insufficient documentation

## 2020-04-12 DIAGNOSIS — R519 Headache, unspecified: Secondary | ICD-10-CM | POA: Diagnosis not present

## 2020-04-12 DIAGNOSIS — S0101XA Laceration without foreign body of scalp, initial encounter: Secondary | ICD-10-CM

## 2020-04-12 DIAGNOSIS — Z79899 Other long term (current) drug therapy: Secondary | ICD-10-CM | POA: Diagnosis not present

## 2020-04-12 DIAGNOSIS — S199XXA Unspecified injury of neck, initial encounter: Secondary | ICD-10-CM | POA: Diagnosis not present

## 2020-04-12 DIAGNOSIS — Z7982 Long term (current) use of aspirin: Secondary | ICD-10-CM | POA: Insufficient documentation

## 2020-04-12 DIAGNOSIS — Y939 Activity, unspecified: Secondary | ICD-10-CM | POA: Insufficient documentation

## 2020-04-12 DIAGNOSIS — W19XXXA Unspecified fall, initial encounter: Secondary | ICD-10-CM

## 2020-04-12 MED ORDER — LIDOCAINE-EPINEPHRINE-TETRACAINE (LET) TOPICAL GEL
3.0000 mL | Freq: Once | TOPICAL | Status: DC
  Filled 2020-04-12: qty 3

## 2020-04-12 NOTE — ED Triage Notes (Signed)
Pt to triage via wheelchair.  Pt fell today striking a door.  Pt has laceration to back of head.  No loc.  No vomiting.  Hx dementia.  Pt had subdural hematoma 02/21/20 and was seen here.  Pt sleepy..  Bleeding controlled.

## 2020-04-12 NOTE — ED Provider Notes (Signed)
[facility] Emergency Department Provider Note  ____________________________________________  Time seen: Approximately 11:04 PM  I have reviewed the triage vital signs and the nursing notes.   HISTORY  Chief Complaint Laceration and Fall   HPI [name] is a 86 y.o. male who presents to the emergency department for treatment and evaluation after a mechanical, nonsyncopal fall at home.  He states that his feet got tangled up and he fell backward and hit his head on the door frame.  Fall was witnessed. He initially seemed to be more sleepy than usual, but not currently. Family concerned because he had a subdural hemorrhage in April.   Past Medical History:  Diagnosis Date  . Fall at home 04/30/2019   Fall at home 2 weeks ago  . MI, old     Patient Active Problem List   Diagnosis Date Noted  . Subdural hemorrhage ([employer]) 02/21/2020  . Chest pain 06/05/2018    Past Surgical History:  Procedure Laterality Date  . CORONARY ANGIOPLASTY WITH STENT PLACEMENT    . hemmorroid    . ROTATOR CUFF REPAIR      Prior to Admission medications   Medication Sig Start Date End Date Taking? Authorizing Provider  acetaminophen (TYLENOL) 500 MG tablet Take 500 mg by mouth every 6 (six) hours as needed for mild pain.     [provider]  aspirin 81 MG tablet Take 81 mg by mouth daily.    [provider]  atorvastatin (LIPITOR) 20 MG tablet Take 20 mg by mouth daily.    [provider]  carvedilol (COREG) 3.125 MG tablet Take 3.125 mg by mouth 2 (two) times daily with a meal.    [provider]  donepezil (ARICEPT) 10 MG tablet Take 10 mg by mouth daily. 12/14/19   [provider]  isosorbide mononitrate (IMDUR) 30 MG 24 hr tablet Take 30 mg by mouth daily. 01/19/20   [provider]  Lactobacillus Probiotic TABS Take by mouth.    [provider]  lisinopril (PRINIVIL,ZESTRIL) 10 MG tablet Take 10 mg by mouth  daily.    [provider]  nitroGLYCERIN (NITROSTAT) 0.4 MG SL tablet Place 1 tablet (0.4 mg total) under the tongue every 5 (five) minutes as needed for chest pain. 06/06/18   Bettey Costa, MD  omeprazole (PRILOSEC) 20 MG capsule Take 20 mg by mouth daily.    [provider]  spironolactone (ALDACTONE) 25 MG tablet Take 25 mg by mouth daily.    [provider]    Allergies Patient has no known allergies.  No family history on file.  Social History Social History   Tobacco Use  . Smoking status: Never Smoker  . Smokeless tobacco: Never Used  Substance Use Topics  . Alcohol use: No  . Drug use: Never    Review of Systems  Constitutional: Negative for fever. Respiratory: Negative for cough or shortness of breath.  Musculoskeletal: Negative for myalgias Skin: Positive for scalp laceration. Neurological: Negative for numbness or paresthesias. ____________________________________________   PHYSICAL EXAM:  VITAL SIGNS: ED Triage Vitals  Enc Vitals Group     BP 04/12/20 1900 137/76     Pulse Rate 04/12/20 1900 65     Resp 04/12/20 1900 18     Temp 04/12/20 1900 98.6 F (37 C)     Temp Source 04/12/20 1900 Oral     SpO2 04/12/20 1900 99 %     Weight 04/12/20 1901 180 lb (81.6 kg)  Height 04/12/20 1901 5\' 8"  (1.727 m)     Head Circumference --      Peak Flow --      Pain Score 04/12/20 1901 0     Pain Loc --      Pain Edu? --      Excl. in [place]? --      Constitutional: Well appearing. Eyes: Conjunctivae are clear without discharge or drainage. Nose: No rhinorrhea noted. Mouth/Throat: Airway is patent.  Neck: No stridor. Unrestricted range of motion observed. Cardiovascular: Capillary refill is <3 seconds.  Respiratory: Respirations are even and unlabored.. Musculoskeletal: Unrestricted range of motion observed. Neurologic: Awake, alert, and oriented x 4.  Skin:  4cm laceration to parietal scalp. No active  bleeding.  ____________________________________________   LABS (all labs ordered are listed, but only abnormal results are displayed)  Labs Reviewed - No data to display ____________________________________________  EKG  Not indicated. ____________________________________________  RADIOLOGY  CT head and cervical spine are both negative for acute findings. ____________________________________________   PROCEDURES  .Marland KitchenLaceration Repair  Date/Time: 04/12/2020 11:19 PM Performed by: Victorino Dike, FNP Authorized by: Victorino Dike, FNP   Consent:    Consent obtained:  Verbal   Consent given by:  Patient   Risks discussed:  Poor cosmetic result and poor wound healing Anesthesia (see MAR for exact dosages):    Anesthesia method:  Topical application   Topical anesthetic:  LET Laceration details:    Location:  Scalp   Scalp location:  R parietal   Length (cm):  4 Repair type:    Repair type:  Simple Treatment:    Area cleansed with:  Hibiclens and saline   Irrigation method:  Tap Skin repair:    Repair method:  Staples   Number of staples:  2 Approximation:    Approximation:  Close Post-procedure details:    Dressing:  Adhesive bandage   Patient tolerance of procedure:  Tolerated well, no immediate complications   ____________________________________________   INITIAL IMPRESSION / ASSESSMENT AND PLAN / ED COURSE  [name] is a 86 y.o. male presenting to the emergency department after mechanical, nonsyncopal fall prior to arrival.  See HPI for further details.  CT of the head and cervical spine are negative for any acute concerns.  Wound was repaired as above.  Patient tolerated procedure well.  Son who is with him tonight will call and schedule follow-up appointment in about 10 days to have them removed.  Patient discharged home in stable condition.   Medications  lidocaine-EPINEPHrine-tetracaine (LET) topical gel (has no administration in time  range)     Pertinent labs & imaging results that were available during my care of the patient were reviewed by me and considered in my medical decision making (see chart for details).  ____________________________________________   FINAL CLINICAL IMPRESSION(S) / ED DIAGNOSES  Final diagnoses:  Fall in home, initial encounter  Scalp laceration, initial encounter    ED Discharge Orders    None       Note:  This document was prepared using Dragon voice recognition software and may include unintentional dictation errors.   Victorino Dike, FNP 04/12/20 2320    Earleen Newport, MD 04/13/20 [phone]

## 2020-04-21 DIAGNOSIS — I2510 Atherosclerotic heart disease of native coronary artery without angina pectoris: Secondary | ICD-10-CM | POA: Diagnosis not present

## 2020-04-21 DIAGNOSIS — R001 Bradycardia, unspecified: Secondary | ICD-10-CM | POA: Diagnosis not present

## 2020-04-21 DIAGNOSIS — S0101XD Laceration without foreign body of scalp, subsequent encounter: Secondary | ICD-10-CM | POA: Diagnosis not present

## 2020-04-21 DIAGNOSIS — I5022 Chronic systolic (congestive) heart failure: Secondary | ICD-10-CM | POA: Diagnosis not present

## 2020-04-21 DIAGNOSIS — F0390 Unspecified dementia without behavioral disturbance: Secondary | ICD-10-CM | POA: Diagnosis not present

## 2020-05-10 DIAGNOSIS — H353221 Exudative age-related macular degeneration, left eye, with active choroidal neovascularization: Secondary | ICD-10-CM | POA: Diagnosis not present

## 2020-06-26 DIAGNOSIS — Z85828 Personal history of other malignant neoplasm of skin: Secondary | ICD-10-CM | POA: Diagnosis not present

## 2020-06-26 DIAGNOSIS — D2272 Melanocytic nevi of left lower limb, including hip: Secondary | ICD-10-CM | POA: Diagnosis not present

## 2020-06-26 DIAGNOSIS — X32XXXA Exposure to sunlight, initial encounter: Secondary | ICD-10-CM | POA: Diagnosis not present

## 2020-06-26 DIAGNOSIS — B353 Tinea pedis: Secondary | ICD-10-CM | POA: Diagnosis not present

## 2020-06-26 DIAGNOSIS — D2261 Melanocytic nevi of right upper limb, including shoulder: Secondary | ICD-10-CM | POA: Diagnosis not present

## 2020-06-26 DIAGNOSIS — H353221 Exudative age-related macular degeneration, left eye, with active choroidal neovascularization: Secondary | ICD-10-CM | POA: Diagnosis not present

## 2020-06-26 DIAGNOSIS — L570 Actinic keratosis: Secondary | ICD-10-CM | POA: Diagnosis not present

## 2020-06-26 DIAGNOSIS — D2262 Melanocytic nevi of left upper limb, including shoulder: Secondary | ICD-10-CM | POA: Diagnosis not present

## 2020-06-26 DIAGNOSIS — D2271 Melanocytic nevi of right lower limb, including hip: Secondary | ICD-10-CM | POA: Diagnosis not present

## 2020-06-26 DIAGNOSIS — D225 Melanocytic nevi of trunk: Secondary | ICD-10-CM | POA: Diagnosis not present

## 2020-07-13 DIAGNOSIS — R21 Rash and other nonspecific skin eruption: Secondary | ICD-10-CM | POA: Diagnosis not present

## 2020-08-14 DIAGNOSIS — H353221 Exudative age-related macular degeneration, left eye, with active choroidal neovascularization: Secondary | ICD-10-CM | POA: Diagnosis not present

## 2020-08-15 DIAGNOSIS — L300 Nummular dermatitis: Secondary | ICD-10-CM | POA: Diagnosis not present

## 2020-08-15 DIAGNOSIS — L309 Dermatitis, unspecified: Secondary | ICD-10-CM | POA: Diagnosis not present

## 2020-09-25 DIAGNOSIS — E7800 Pure hypercholesterolemia, unspecified: Secondary | ICD-10-CM | POA: Diagnosis not present

## 2020-09-25 DIAGNOSIS — I10 Essential (primary) hypertension: Secondary | ICD-10-CM | POA: Diagnosis not present

## 2020-10-02 DIAGNOSIS — Z0000 Encounter for general adult medical examination without abnormal findings: Secondary | ICD-10-CM | POA: Diagnosis not present

## 2020-10-02 DIAGNOSIS — E785 Hyperlipidemia, unspecified: Secondary | ICD-10-CM | POA: Diagnosis not present

## 2020-10-03 DIAGNOSIS — I2510 Atherosclerotic heart disease of native coronary artery without angina pectoris: Secondary | ICD-10-CM | POA: Diagnosis not present

## 2020-10-03 DIAGNOSIS — R0600 Dyspnea, unspecified: Secondary | ICD-10-CM | POA: Diagnosis not present

## 2020-10-03 DIAGNOSIS — I340 Nonrheumatic mitral (valve) insufficiency: Secondary | ICD-10-CM | POA: Diagnosis not present

## 2020-10-03 DIAGNOSIS — I10 Essential (primary) hypertension: Secondary | ICD-10-CM | POA: Diagnosis not present

## 2020-10-03 DIAGNOSIS — I071 Rheumatic tricuspid insufficiency: Secondary | ICD-10-CM | POA: Diagnosis not present

## 2020-10-03 DIAGNOSIS — E7800 Pure hypercholesterolemia, unspecified: Secondary | ICD-10-CM | POA: Diagnosis not present

## 2020-10-03 DIAGNOSIS — I5022 Chronic systolic (congestive) heart failure: Secondary | ICD-10-CM | POA: Diagnosis not present

## 2020-10-03 DIAGNOSIS — I471 Supraventricular tachycardia: Secondary | ICD-10-CM | POA: Diagnosis not present

## 2020-10-06 DIAGNOSIS — H353221 Exudative age-related macular degeneration, left eye, with active choroidal neovascularization: Secondary | ICD-10-CM | POA: Diagnosis not present

## 2020-11-30 DIAGNOSIS — H353221 Exudative age-related macular degeneration, left eye, with active choroidal neovascularization: Secondary | ICD-10-CM | POA: Diagnosis not present

## 2020-12-21 DIAGNOSIS — I2510 Atherosclerotic heart disease of native coronary artery without angina pectoris: Secondary | ICD-10-CM | POA: Diagnosis not present

## 2020-12-21 DIAGNOSIS — R0600 Dyspnea, unspecified: Secondary | ICD-10-CM | POA: Diagnosis not present

## 2020-12-21 DIAGNOSIS — I5022 Chronic systolic (congestive) heart failure: Secondary | ICD-10-CM | POA: Diagnosis not present

## 2020-12-29 DIAGNOSIS — X32XXXA Exposure to sunlight, initial encounter: Secondary | ICD-10-CM | POA: Diagnosis not present

## 2020-12-29 DIAGNOSIS — L308 Other specified dermatitis: Secondary | ICD-10-CM | POA: Diagnosis not present

## 2020-12-29 DIAGNOSIS — L570 Actinic keratosis: Secondary | ICD-10-CM | POA: Diagnosis not present

## 2021-01-05 DIAGNOSIS — E7800 Pure hypercholesterolemia, unspecified: Secondary | ICD-10-CM | POA: Diagnosis not present

## 2021-01-08 DIAGNOSIS — E7800 Pure hypercholesterolemia, unspecified: Secondary | ICD-10-CM | POA: Diagnosis not present

## 2021-01-08 DIAGNOSIS — I110 Hypertensive heart disease with heart failure: Secondary | ICD-10-CM | POA: Diagnosis not present

## 2021-01-08 DIAGNOSIS — G301 Alzheimer's disease with late onset: Secondary | ICD-10-CM | POA: Diagnosis not present

## 2021-01-08 DIAGNOSIS — Z862 Personal history of diseases of the blood and blood-forming organs and certain disorders involving the immune mechanism: Secondary | ICD-10-CM | POA: Diagnosis not present

## 2021-01-08 DIAGNOSIS — F0280 Dementia in other diseases classified elsewhere without behavioral disturbance: Secondary | ICD-10-CM | POA: Diagnosis not present

## 2021-01-08 DIAGNOSIS — I5022 Chronic systolic (congestive) heart failure: Secondary | ICD-10-CM | POA: Diagnosis not present

## 2021-01-15 DIAGNOSIS — E7800 Pure hypercholesterolemia, unspecified: Secondary | ICD-10-CM | POA: Diagnosis not present

## 2021-01-22 DIAGNOSIS — R062 Wheezing: Secondary | ICD-10-CM | POA: Diagnosis not present

## 2021-01-22 DIAGNOSIS — J811 Chronic pulmonary edema: Secondary | ICD-10-CM | POA: Diagnosis not present

## 2021-01-22 DIAGNOSIS — H6123 Impacted cerumen, bilateral: Secondary | ICD-10-CM | POA: Diagnosis not present

## 2021-01-22 DIAGNOSIS — I517 Cardiomegaly: Secondary | ICD-10-CM | POA: Diagnosis not present

## 2021-02-01 DIAGNOSIS — Z0100 Encounter for examination of eyes and vision without abnormal findings: Secondary | ICD-10-CM | POA: Diagnosis not present

## 2021-02-01 DIAGNOSIS — H353221 Exudative age-related macular degeneration, left eye, with active choroidal neovascularization: Secondary | ICD-10-CM | POA: Diagnosis not present

## 2021-02-01 DIAGNOSIS — H353131 Nonexudative age-related macular degeneration, bilateral, early dry stage: Secondary | ICD-10-CM | POA: Diagnosis not present

## 2021-02-08 DIAGNOSIS — I517 Cardiomegaly: Secondary | ICD-10-CM | POA: Diagnosis not present

## 2021-02-08 DIAGNOSIS — I5022 Chronic systolic (congestive) heart failure: Secondary | ICD-10-CM | POA: Diagnosis not present

## 2021-02-08 DIAGNOSIS — J9811 Atelectasis: Secondary | ICD-10-CM | POA: Diagnosis not present

## 2021-02-08 DIAGNOSIS — M19011 Primary osteoarthritis, right shoulder: Secondary | ICD-10-CM | POA: Diagnosis not present

## 2021-02-08 DIAGNOSIS — I509 Heart failure, unspecified: Secondary | ICD-10-CM | POA: Diagnosis not present

## 2021-02-08 DIAGNOSIS — M25511 Pain in right shoulder: Secondary | ICD-10-CM | POA: Diagnosis not present

## 2021-02-08 DIAGNOSIS — I110 Hypertensive heart disease with heart failure: Secondary | ICD-10-CM | POA: Diagnosis not present

## 2021-02-08 DIAGNOSIS — I10 Essential (primary) hypertension: Secondary | ICD-10-CM | POA: Diagnosis not present

## 2021-02-25 IMAGING — CT NUCLEAR MEDICINE BONE SPECT
1 series · 12 of 14 positions shown, 15 images · non-contrast
Comparison: None.

CLINICAL DATA: Fall onto back side.  Persistent pain.

EXAM:
NM BONE SCAN AND SPECT IMAGING
TECHNIQUE: After intravenous injection of radiopharmaceutical, delayed planar
images were obtained in multiple projections. Additionally, delayed
triplanar SPECT images were obtained through the area of interest.
RADIOPHARMACEUTICALS:  Twenty-one point mCi 4c-JJm MDP

[Series 4: 3d bone 1.25 b70s · axial · 0.98mm/px · z∈[+1092,+1406]mm · 12 of 530 slices shown, 15 images]
[im 41/530  soft-tissue]
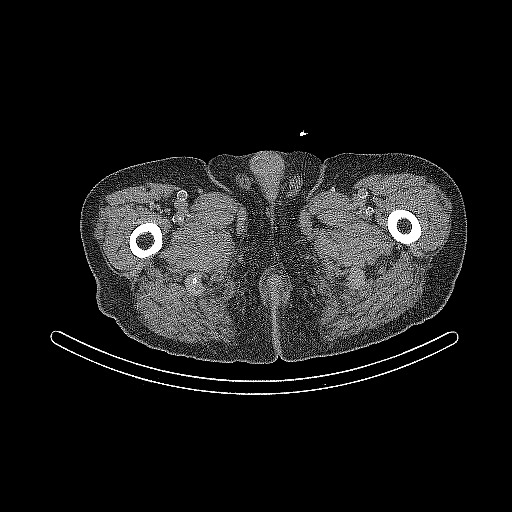
[im 41/530  bone]
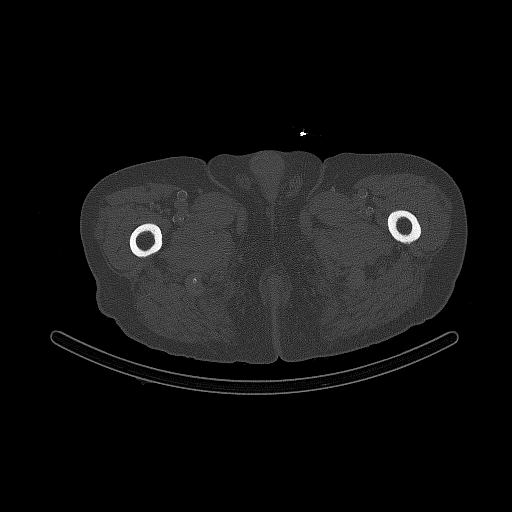
[im 82/530  bone]
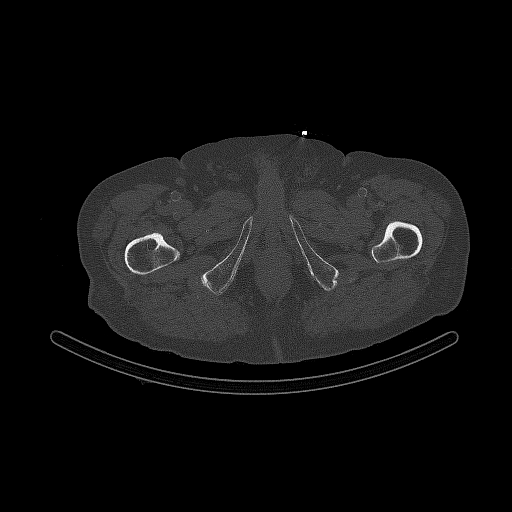
[im 123/530  bone]
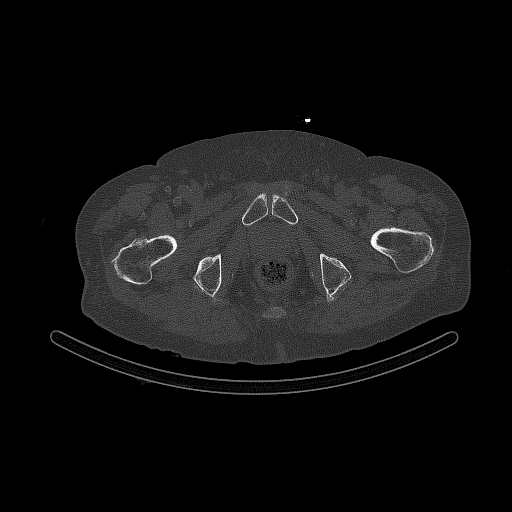
[im 163/530  bone]
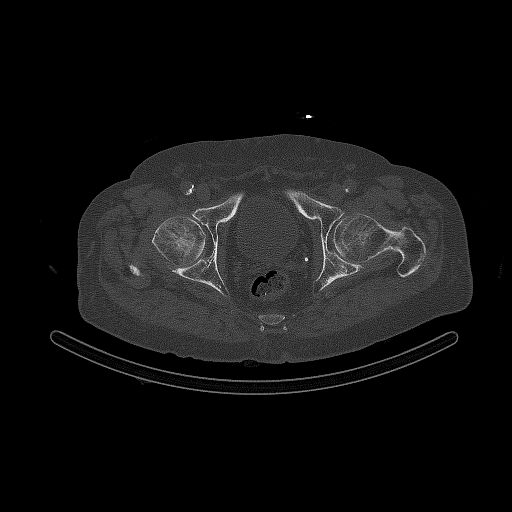
[im 204/530  soft-tissue]
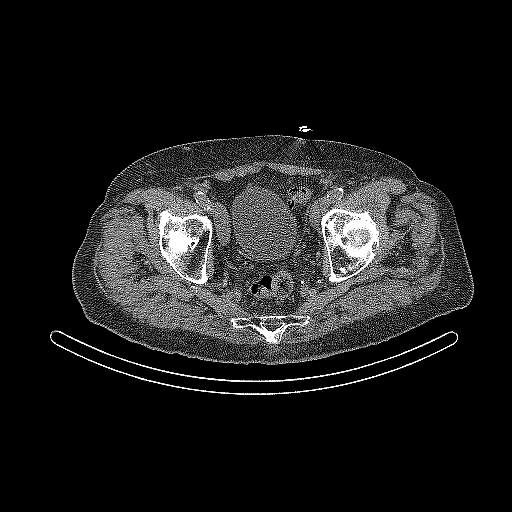
[im 204/530  bone]
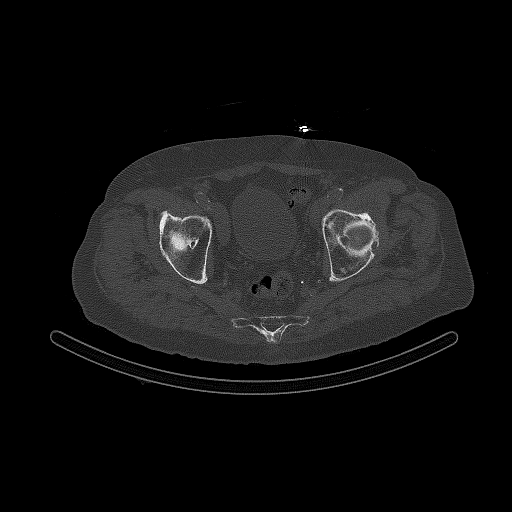
[im 245/530  bone]
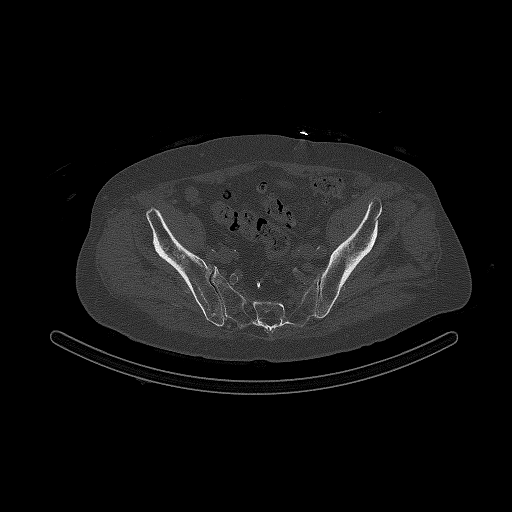
[im 285/530  bone]
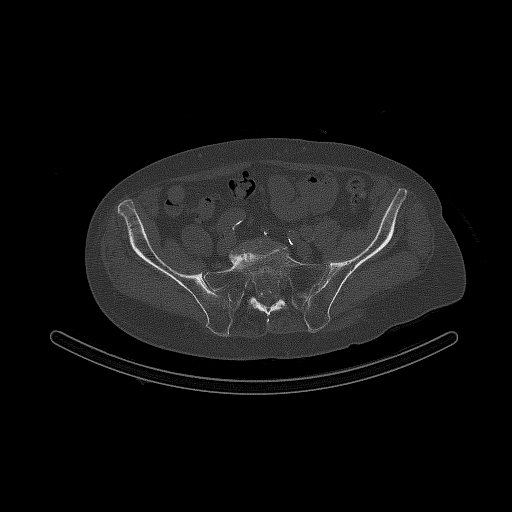
[im 326/530  bone]
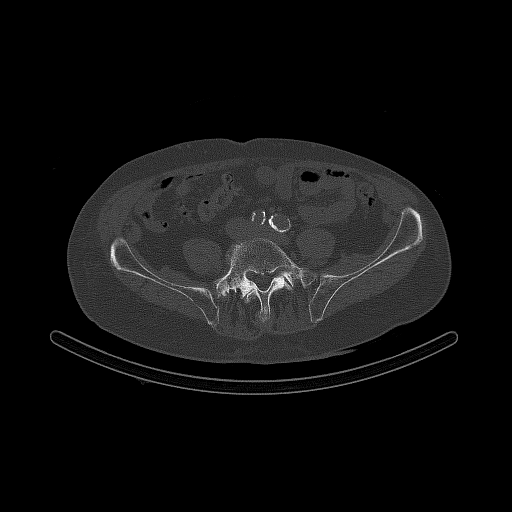
[im 367/530  soft-tissue]
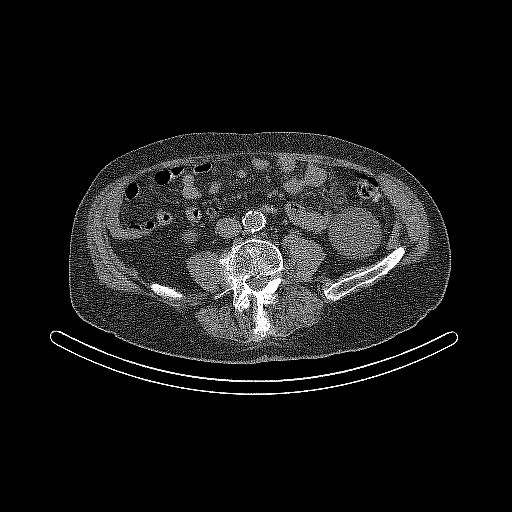
[im 367/530  bone]
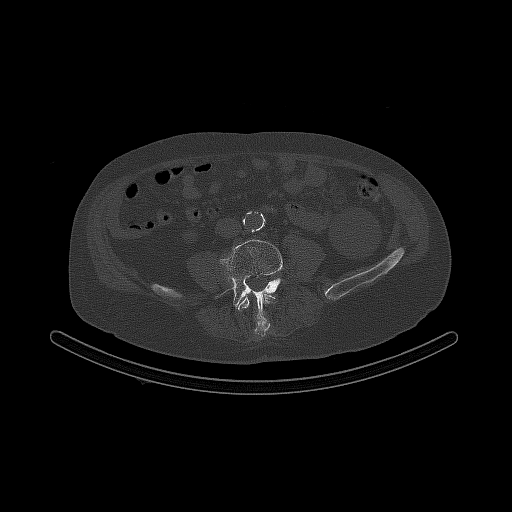
[im 407/530  bone]
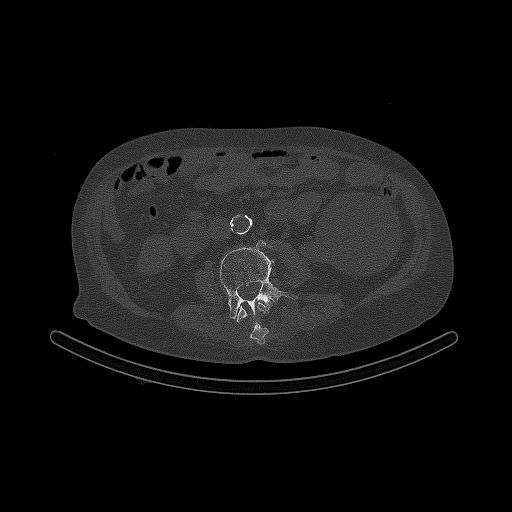
[im 448/530  bone]
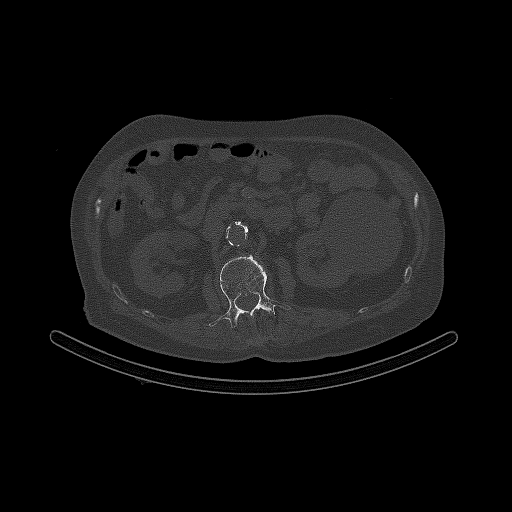
[im 489/530  bone]
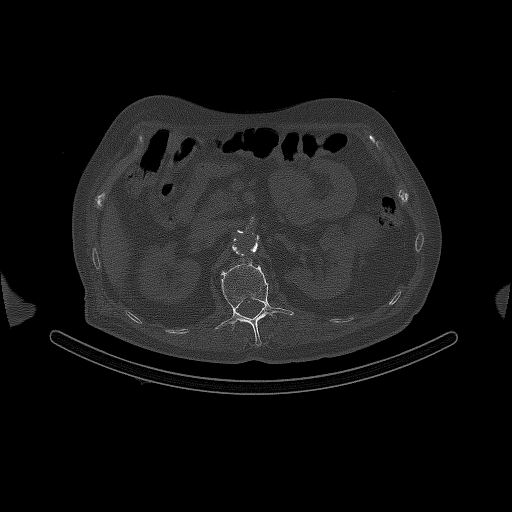

[12 of 14 positions shown; findings below may reference images not displayed]

FINDINGS: There is intense radiotracer activity localizing to the 2 most
distal coccyx elements. There is no evidence of displaced fracture.
A nondisplaced fracture noted on the CT portion in the second to
last segment.

Additional focal activity at the L5-S1 disc level consistent with
degenerative change.
IMPRESSION: Subtle nondisplaced fractures of the 2 most distal coccyx segments.

## 2021-03-22 DIAGNOSIS — H353221 Exudative age-related macular degeneration, left eye, with active choroidal neovascularization: Secondary | ICD-10-CM | POA: Diagnosis not present

## 2021-05-03 DIAGNOSIS — E7800 Pure hypercholesterolemia, unspecified: Secondary | ICD-10-CM | POA: Diagnosis not present

## 2021-05-03 DIAGNOSIS — Z862 Personal history of diseases of the blood and blood-forming organs and certain disorders involving the immune mechanism: Secondary | ICD-10-CM | POA: Diagnosis not present

## 2021-05-04 ENCOUNTER — Emergency Department: Payer: Medicare HMO

## 2021-05-04 ENCOUNTER — Other Ambulatory Visit: Payer: Self-pay

## 2021-05-04 DIAGNOSIS — I517 Cardiomegaly: Secondary | ICD-10-CM | POA: Diagnosis not present

## 2021-05-04 DIAGNOSIS — Z955 Presence of coronary angioplasty implant and graft: Secondary | ICD-10-CM | POA: Diagnosis not present

## 2021-05-04 DIAGNOSIS — J320 Chronic maxillary sinusitis: Secondary | ICD-10-CM | POA: Diagnosis not present

## 2021-05-04 DIAGNOSIS — S0101XA Laceration without foreign body of scalp, initial encounter: Secondary | ICD-10-CM | POA: Diagnosis not present

## 2021-05-04 DIAGNOSIS — Z7982 Long term (current) use of aspirin: Secondary | ICD-10-CM | POA: Insufficient documentation

## 2021-05-04 DIAGNOSIS — S0990XA Unspecified injury of head, initial encounter: Secondary | ICD-10-CM | POA: Diagnosis not present

## 2021-05-04 DIAGNOSIS — Z79899 Other long term (current) drug therapy: Secondary | ICD-10-CM | POA: Insufficient documentation

## 2021-05-04 DIAGNOSIS — W182XXA Fall in (into) shower or empty bathtub, initial encounter: Secondary | ICD-10-CM | POA: Diagnosis not present

## 2021-05-04 DIAGNOSIS — S13140A Subluxation of C3/C4 cervical vertebrae, initial encounter: Secondary | ICD-10-CM | POA: Diagnosis not present

## 2021-05-04 DIAGNOSIS — I672 Cerebral atherosclerosis: Secondary | ICD-10-CM | POA: Diagnosis not present

## 2021-05-04 DIAGNOSIS — M2578 Osteophyte, vertebrae: Secondary | ICD-10-CM | POA: Diagnosis not present

## 2021-05-04 DIAGNOSIS — J3489 Other specified disorders of nose and nasal sinuses: Secondary | ICD-10-CM | POA: Diagnosis not present

## 2021-05-04 NOTE — ED Triage Notes (Signed)
Pt presents via POV c/o fall while getting out of shower. Pt son reports pt denies LOC. Pt has hx of falling per son. Denies anticoagulation per sone. Laceration noted to back of head. Bleeding controlled. Pt has hx dementia per son.

## 2021-05-05 ENCOUNTER — Emergency Department
Admission: EM | Admit: 2021-05-05 | Discharge: 2021-05-05 | Disposition: A | Discharge status: Home / Self Care | Payer: Medicare HMO | Attending: Emergency Medicine | Admitting: Emergency Medicine

## 2021-05-05 DIAGNOSIS — S0990XA Unspecified injury of head, initial encounter: Secondary | ICD-10-CM

## 2021-05-05 DIAGNOSIS — S0101XA Laceration without foreign body of scalp, initial encounter: Secondary | ICD-10-CM

## 2021-05-05 NOTE — Discharge Instructions (Addendum)
Staple removal in 7 to 10 days.  Return to the ER for worsening symptoms, persistent vomiting, lethargy or other concerns. ?

## 2021-05-05 NOTE — ED Provider Notes (Signed)
[facility] Emergency Department Provider Note   ____________________________________________   Event Date/Time   First MD Initiated Contact with Patient 05/05/21 [phone]     (approximate)  I have reviewed the triage vital signs and the nursing notes.   HISTORY  Chief Complaint Head Laceration    HPI AVREY HYSER is a 87 y.o. male brought to the ED from home by his son status post mechanical fall with head injury.  Patient fell while getting out of the shower, struck his occiput.  Denies LOC.  Son states patient takes baby aspirin daily.  History of falls, sometimes uses walker.  Denies vision changes, neck pain, chest pain, shortness of breath, abdominal pain, nausea, vomiting or dizziness.  Tetanus is up-to-date.     Past Medical History:  Diagnosis Date   Fall at home 04/30/2019   Fall at home 2 weeks ago   MI, old     Patient Active Problem List   Diagnosis Date Noted   Subdural hemorrhage ([place]) 02/21/2020   Chest pain 06/05/2018    Past Surgical History:  Procedure Laterality Date   CORONARY ANGIOPLASTY WITH STENT PLACEMENT     hemmorroid     ROTATOR CUFF REPAIR      Prior to Admission medications   Medication Sig Start Date End Date Taking? Authorizing Provider  acetaminophen (TYLENOL) 500 MG tablet Take 500 mg by mouth every 6 (six) hours as needed for mild pain.     [provider]  aspirin 81 MG tablet Take 81 mg by mouth daily.    [provider]  atorvastatin (LIPITOR) 20 MG tablet Take 20 mg by mouth daily.    [provider]  carvedilol (COREG) 3.125 MG tablet Take 3.125 mg by mouth 2 (two) times daily with a meal.    [provider]  donepezil (ARICEPT) 10 MG tablet Take 10 mg by mouth daily. 12/14/19   [provider]  isosorbide mononitrate (IMDUR) 30 MG 24 hr tablet Take 30 mg by mouth daily. 01/19/20   [provider]  Lactobacillus Probiotic TABS Take by mouth.     [provider]  lisinopril (PRINIVIL,ZESTRIL) 10 MG tablet Take 10 mg by mouth daily.    [provider]  nitroGLYCERIN (NITROSTAT) 0.4 MG SL tablet Place 1 tablet (0.4 mg total) under the tongue every 5 (five) minutes as needed for chest pain. 06/06/18   Bettey Costa, MD  omeprazole (PRILOSEC) 20 MG capsule Take 20 mg by mouth daily.    [provider]  spironolactone (ALDACTONE) 25 MG tablet Take 25 mg by mouth daily.    [provider]    Allergies Patient has no known allergies.  No family history on file.  Social History Social History   Tobacco Use   Smoking status: Never   Smokeless tobacco: Never  Substance Use Topics   Alcohol use: No   Drug use: Never    Review of Systems  Constitutional: No fever/chills Eyes: No visual changes. ENT: No sore throat. Cardiovascular: Denies chest pain. Respiratory: Denies shortness of breath. Gastrointestinal: No abdominal pain.  No nausea, no vomiting.  No diarrhea.  No constipation. Genitourinary: Negative for dysuria. Musculoskeletal: Negative for back pain. Skin: Negative for rash. Neurological: Positive for head injury with laceration.  Negative for headaches, focal weakness or numbness.   ____________________________________________   PHYSICAL EXAM:  VITAL SIGNS: ED Triage Vitals  Enc Vitals Group     BP 05/04/21 2119 125/68  Pulse Rate 05/04/21 2119 65     Resp 05/04/21 2119 14     Temp 05/04/21 2119 97.6 F (36.4 C)     Temp Source 05/04/21 2119 Oral     SpO2 05/04/21 2119 98 %     Weight --      Height --      Head Circumference --      Peak Flow --      Pain Score 05/04/21 2123 0     Pain Loc --      Pain Edu? --      Excl. in [place]? --     Constitutional: Alert and oriented.  Elderly appearing, hard of hearing and in no acute distress. Eyes: Conjunctivae are normal. PERRL. EOMI. Head: Approximately 7cm vertically linear, superficial, well approximated posterior  laceration without active bleeding. Nose: Atraumatic. Mouth/Throat: Mucous membranes are mildly dry. No dental malocclusion. Neck: No stridor.  No cervical spine tenderness to palpation. Cardiovascular: Normal rate, regular rhythm. Grossly normal heart sounds.  Good peripheral circulation. Respiratory: Normal respiratory effort.  No retractions. Lungs CTAB. Gastrointestinal: Soft and nontender to light or deep palpation. No distention. No abdominal bruits. No CVA tenderness. Musculoskeletal: No lower extremity tenderness nor edema.  No joint effusions. Neurologic: Alert and oriented to person and place which is baseline for patient.  Normal speech and language. No gross focal neurologic deficits are appreciated. MAEx4. Skin:  Skin is warm, dry and intact. No rash noted. Psychiatric: Mood and affect are normal. Speech and behavior are normal.  ____________________________________________   LABS (all labs ordered are listed, but only abnormal results are displayed)  Labs Reviewed - No data to display ____________________________________________  EKG  None ____________________________________________  RADIOLOGY I, Edy Belt J, personally viewed and evaluated these images (plain radiographs) as part of my medical decision making, as well as reviewing the written report by the radiologist.  ED MD interpretation: No ICH, no cervical spine injury  Official radiology report(s): CT Head Wo Contrast  Result Date: 05/04/2021 CLINICAL DATA:  [place] while getting out of shower. No loss of consciousness. EXAM: CT HEAD WITHOUT CONTRAST CT CERVICAL SPINE WITHOUT CONTRAST TECHNIQUE: Multidetector CT imaging of the head and cervical spine was performed following the standard protocol without intravenous contrast. Multiplanar CT image reconstructions of the cervical spine were also generated. COMPARISON:  04/12/2020 FINDINGS: CT HEAD FINDINGS Brain: No evidence of acute infarction, hemorrhage,  hydrocephalus, extra-axial collection or mass lesion/mass effect. Diffuse cerebral atrophy. Ventricular dilatation likely due to central atrophy. Patchy low-attenuation changes in the deep white matter consistent with small vessel ischemia. Vascular: Prominent intracranial arterial vascular calcifications. Skull: The calvarium appears intact. Sinuses/Orbits: Opacification of the visualized right maxillary antrum. Mild mucosal thickening in the paranasal sinuses. Mastoid air cells are clear. Other: None. CT CERVICAL SPINE FINDINGS Alignment: Reversal of the usual cervical lordosis with mild anterior subluxation at C3-4. Alignment is similar to prior study. Skull base and vertebrae: No acute fracture. No primary bone lesion or focal pathologic process. Soft tissues and spinal canal: No prevertebral fluid or swelling. No visible canal hematoma. Disc levels: Degenerative changes throughout with narrowed interspaces and endplate osteophyte formation. Prominent disc osteophyte complex causes some effacement of the anterior thecal sac at C4-5, C5-6, and C7-T1 levels. Degenerative changes in the facet joints. Upper chest: Lung apices are clear. Other: None. IMPRESSION: 1. No acute intracranial abnormalities. Chronic atrophy and small vessel ischemic changes. 2. No acute displaced fractures of the cervical spine. Reversal of the usual cervical lordosis  is unchanged since prior study. Electronically Signed   By: Lucienne Capers M.D.   On: 05/04/2021 22:15   CT Cervical Spine Wo Contrast  Result Date: 05/04/2021 CLINICAL DATA:  [place] while getting out of shower. No loss of consciousness. EXAM: CT HEAD WITHOUT CONTRAST CT CERVICAL SPINE WITHOUT CONTRAST TECHNIQUE: Multidetector CT imaging of the head and cervical spine was performed following the standard protocol without intravenous contrast. Multiplanar CT image reconstructions of the cervical spine were also generated. COMPARISON:  04/12/2020 FINDINGS: CT HEAD FINDINGS  Brain: No evidence of acute infarction, hemorrhage, hydrocephalus, extra-axial collection or mass lesion/mass effect. Diffuse cerebral atrophy. Ventricular dilatation likely due to central atrophy. Patchy low-attenuation changes in the deep white matter consistent with small vessel ischemia. Vascular: Prominent intracranial arterial vascular calcifications. Skull: The calvarium appears intact. Sinuses/Orbits: Opacification of the visualized right maxillary antrum. Mild mucosal thickening in the paranasal sinuses. Mastoid air cells are clear. Other: None. CT CERVICAL SPINE FINDINGS Alignment: Reversal of the usual cervical lordosis with mild anterior subluxation at C3-4. Alignment is similar to prior study. Skull base and vertebrae: No acute fracture. No primary bone lesion or focal pathologic process. Soft tissues and spinal canal: No prevertebral fluid or swelling. No visible canal hematoma. Disc levels: Degenerative changes throughout with narrowed interspaces and endplate osteophyte formation. Prominent disc osteophyte complex causes some effacement of the anterior thecal sac at C4-5, C5-6, and C7-T1 levels. Degenerative changes in the facet joints. Upper chest: Lung apices are clear. Other: None. IMPRESSION: 1. No acute intracranial abnormalities. Chronic atrophy and small vessel ischemic changes. 2. No acute displaced fractures of the cervical spine. Reversal of the usual cervical lordosis is unchanged since prior study. Electronically Signed   By: Lucienne Capers M.D.   On: 05/04/2021 22:15    ____________________________________________   PROCEDURES  Procedure(s) performed (including Critical Care):  Marland KitchenMarland KitchenLaceration Repair  Date/Time: 05/05/2021 2:24 AM Performed by: Paulette Blanch, MD Authorized by: Paulette Blanch, MD   Consent:    Consent obtained:  Verbal   Consent given by:  Patient   Risks, benefits, and alternatives were discussed: yes     Risks discussed:  Infection, pain and poor wound  healing Anesthesia:    Anesthesia method:  Topical application   Topical anesthesia: PainEase. Laceration details:    Location:  Scalp   Scalp location:  Occipital   Length (cm):  7   Depth (mm):  2 Treatment:    Area cleansed with:  Chlorhexidine   Amount of cleaning:  Standard Skin repair:    Repair method:  Staples   Number of staples:  8 Approximation:    Approximation:  Close Repair type:    Repair type:  Simple Post-procedure details:    Dressing:  Open (no dressing)   Procedure completion:  Tolerated well, no immediate complications   ____________________________________________   INITIAL IMPRESSION / ASSESSMENT AND PLAN / ED COURSE  As part of my medical decision making, I reviewed the following data within the electronic MEDICAL RECORD NUMBER History obtained from family, Nursing notes reviewed and incorporated, Radiograph reviewed, and Notes from prior ED visits     87 year old male who presents with mechanical fall with scalp laceration.  CT head and cervical spine unremarkable for acute traumatic injury.  Patient tolerated staple repair well.  Strict return precautions given.  Patient and son verbalized understanding agree with plan of care.      ____________________________________________   FINAL CLINICAL IMPRESSION(S) / ED DIAGNOSES  Final diagnoses:  Injury of head, initial encounter  Laceration of scalp, initial encounter     ED Discharge Orders     None        Note:  This document was prepared using Dragon voice recognition software and may include unintentional dictation errors.    Paulette Blanch, MD 05/05/21 [phone]

## 2021-05-10 DIAGNOSIS — E7800 Pure hypercholesterolemia, unspecified: Secondary | ICD-10-CM | POA: Diagnosis not present

## 2021-05-10 DIAGNOSIS — Z0000 Encounter for general adult medical examination without abnormal findings: Secondary | ICD-10-CM | POA: Diagnosis not present

## 2021-05-10 DIAGNOSIS — I10 Essential (primary) hypertension: Secondary | ICD-10-CM | POA: Diagnosis not present

## 2021-05-10 DIAGNOSIS — G301 Alzheimer's disease with late onset: Secondary | ICD-10-CM | POA: Diagnosis not present

## 2021-05-10 DIAGNOSIS — Z862 Personal history of diseases of the blood and blood-forming organs and certain disorders involving the immune mechanism: Secondary | ICD-10-CM | POA: Diagnosis not present

## 2021-05-10 DIAGNOSIS — F0280 Dementia in other diseases classified elsewhere without behavioral disturbance: Secondary | ICD-10-CM | POA: Diagnosis not present

## 2021-05-10 DIAGNOSIS — D696 Thrombocytopenia, unspecified: Secondary | ICD-10-CM | POA: Diagnosis not present

## 2021-05-14 DIAGNOSIS — F0280 Dementia in other diseases classified elsewhere without behavioral disturbance: Secondary | ICD-10-CM | POA: Diagnosis not present

## 2021-05-14 DIAGNOSIS — Z4802 Encounter for removal of sutures: Secondary | ICD-10-CM | POA: Diagnosis not present

## 2021-05-14 DIAGNOSIS — I10 Essential (primary) hypertension: Secondary | ICD-10-CM | POA: Diagnosis not present

## 2021-05-14 DIAGNOSIS — G301 Alzheimer's disease with late onset: Secondary | ICD-10-CM | POA: Diagnosis not present

## 2021-05-25 DIAGNOSIS — H353221 Exudative age-related macular degeneration, left eye, with active choroidal neovascularization: Secondary | ICD-10-CM | POA: Diagnosis not present

## 2021-07-17 DIAGNOSIS — M79672 Pain in left foot: Secondary | ICD-10-CM | POA: Diagnosis not present

## 2021-07-17 DIAGNOSIS — M25572 Pain in left ankle and joints of left foot: Secondary | ICD-10-CM | POA: Diagnosis not present

## 2021-07-17 DIAGNOSIS — M7989 Other specified soft tissue disorders: Secondary | ICD-10-CM | POA: Diagnosis not present

## 2021-07-17 DIAGNOSIS — Y92009 Unspecified place in unspecified non-institutional (private) residence as the place of occurrence of the external cause: Secondary | ICD-10-CM | POA: Diagnosis not present

## 2021-07-17 DIAGNOSIS — W010XXA Fall on same level from slipping, tripping and stumbling without subsequent striking against object, initial encounter: Secondary | ICD-10-CM | POA: Diagnosis not present

## 2021-07-17 DIAGNOSIS — I25118 Atherosclerotic heart disease of native coronary artery with other forms of angina pectoris: Secondary | ICD-10-CM | POA: Diagnosis not present

## 2021-08-24 DIAGNOSIS — H353221 Exudative age-related macular degeneration, left eye, with active choroidal neovascularization: Secondary | ICD-10-CM | POA: Diagnosis not present

## 2021-09-17 DIAGNOSIS — Z03818 Encounter for observation for suspected exposure to other biological agents ruled out: Secondary | ICD-10-CM | POA: Diagnosis not present

## 2021-09-17 DIAGNOSIS — J069 Acute upper respiratory infection, unspecified: Secondary | ICD-10-CM | POA: Diagnosis not present

## 2021-09-17 DIAGNOSIS — I959 Hypotension, unspecified: Secondary | ICD-10-CM | POA: Diagnosis not present

## 2021-09-17 DIAGNOSIS — R5383 Other fatigue: Secondary | ICD-10-CM | POA: Diagnosis not present

## 2021-09-17 DIAGNOSIS — K529 Noninfective gastroenteritis and colitis, unspecified: Secondary | ICD-10-CM | POA: Diagnosis not present

## 2021-09-18 DIAGNOSIS — D649 Anemia, unspecified: Secondary | ICD-10-CM | POA: Diagnosis not present

## 2021-11-02 DIAGNOSIS — D696 Thrombocytopenia, unspecified: Secondary | ICD-10-CM | POA: Diagnosis not present

## 2021-11-02 DIAGNOSIS — E7800 Pure hypercholesterolemia, unspecified: Secondary | ICD-10-CM | POA: Diagnosis not present

## 2021-11-30 DIAGNOSIS — H353221 Exudative age-related macular degeneration, left eye, with active choroidal neovascularization: Secondary | ICD-10-CM | POA: Diagnosis not present

## 2022-02-01 ENCOUNTER — Emergency Department: Payer: Medicare HMO

## 2022-02-01 ENCOUNTER — Other Ambulatory Visit: Payer: Self-pay

## 2022-02-01 ENCOUNTER — Inpatient Hospital Stay
Admission: EM | Admit: 2022-02-01 | Discharge: 2022-02-12 | DRG: 291 | Disposition: A | Discharge status: Hospice | Payer: Medicare HMO | Attending: Internal Medicine | Admitting: Internal Medicine

## 2022-02-01 ENCOUNTER — Encounter: Payer: Self-pay | Admitting: Internal Medicine

## 2022-02-01 DIAGNOSIS — I429 Cardiomyopathy, unspecified: Secondary | ICD-10-CM | POA: Diagnosis present

## 2022-02-01 DIAGNOSIS — F05 Delirium due to known physiological condition: Secondary | ICD-10-CM | POA: Diagnosis not present

## 2022-02-01 DIAGNOSIS — I509 Heart failure, unspecified: Secondary | ICD-10-CM | POA: Diagnosis present

## 2022-02-01 DIAGNOSIS — Z66 Do not resuscitate: Secondary | ICD-10-CM | POA: Diagnosis present

## 2022-02-01 DIAGNOSIS — I483 Typical atrial flutter: Secondary | ICD-10-CM | POA: Diagnosis not present

## 2022-02-01 DIAGNOSIS — N179 Acute kidney failure, unspecified: Secondary | ICD-10-CM

## 2022-02-01 DIAGNOSIS — E7800 Pure hypercholesterolemia, unspecified: Secondary | ICD-10-CM | POA: Diagnosis present

## 2022-02-01 DIAGNOSIS — R54 Age-related physical debility: Secondary | ICD-10-CM | POA: Diagnosis present

## 2022-02-01 DIAGNOSIS — G301 Alzheimer's disease with late onset: Secondary | ICD-10-CM | POA: Diagnosis present

## 2022-02-01 DIAGNOSIS — R531 Weakness: Secondary | ICD-10-CM

## 2022-02-01 DIAGNOSIS — G9341 Metabolic encephalopathy: Secondary | ICD-10-CM | POA: Diagnosis not present

## 2022-02-01 DIAGNOSIS — Z8249 Family history of ischemic heart disease and other diseases of the circulatory system: Secondary | ICD-10-CM | POA: Diagnosis not present

## 2022-02-01 DIAGNOSIS — N390 Urinary tract infection, site not specified: Secondary | ICD-10-CM | POA: Diagnosis present

## 2022-02-01 DIAGNOSIS — E876 Hypokalemia: Secondary | ICD-10-CM

## 2022-02-01 DIAGNOSIS — Z955 Presence of coronary angioplasty implant and graft: Secondary | ICD-10-CM | POA: Diagnosis not present

## 2022-02-01 DIAGNOSIS — R0902 Hypoxemia: Secondary | ICD-10-CM | POA: Diagnosis not present

## 2022-02-01 DIAGNOSIS — R410 Disorientation, unspecified: Secondary | ICD-10-CM | POA: Diagnosis not present

## 2022-02-01 DIAGNOSIS — Z515 Encounter for palliative care: Secondary | ICD-10-CM

## 2022-02-01 DIAGNOSIS — I083 Combined rheumatic disorders of mitral, aortic and tricuspid valves: Secondary | ICD-10-CM | POA: Diagnosis present

## 2022-02-01 DIAGNOSIS — I5023 Acute on chronic systolic (congestive) heart failure: Secondary | ICD-10-CM

## 2022-02-01 DIAGNOSIS — I479 Paroxysmal tachycardia, unspecified: Secondary | ICD-10-CM | POA: Diagnosis not present

## 2022-02-01 DIAGNOSIS — R4182 Altered mental status, unspecified: Secondary | ICD-10-CM | POA: Diagnosis not present

## 2022-02-01 DIAGNOSIS — E785 Hyperlipidemia, unspecified: Secondary | ICD-10-CM | POA: Diagnosis not present

## 2022-02-01 DIAGNOSIS — F03B0 Unspecified dementia, moderate, without behavioral disturbance, psychotic disturbance, mood disturbance, and anxiety: Secondary | ICD-10-CM | POA: Diagnosis not present

## 2022-02-01 DIAGNOSIS — I4891 Unspecified atrial fibrillation: Secondary | ICD-10-CM | POA: Diagnosis present

## 2022-02-01 DIAGNOSIS — Z7982 Long term (current) use of aspirin: Secondary | ICD-10-CM

## 2022-02-01 DIAGNOSIS — R4020 Unspecified coma: Secondary | ICD-10-CM | POA: Diagnosis not present

## 2022-02-01 DIAGNOSIS — Z79899 Other long term (current) drug therapy: Secondary | ICD-10-CM | POA: Diagnosis not present

## 2022-02-01 DIAGNOSIS — I5031 Acute diastolic (congestive) heart failure: Secondary | ICD-10-CM | POA: Diagnosis not present

## 2022-02-01 DIAGNOSIS — F0280 Dementia in other diseases classified elsewhere without behavioral disturbance: Secondary | ICD-10-CM | POA: Diagnosis present

## 2022-02-01 DIAGNOSIS — Z6820 Body mass index (BMI) 20.0-20.9, adult: Secondary | ICD-10-CM

## 2022-02-01 DIAGNOSIS — I471 Supraventricular tachycardia: Secondary | ICD-10-CM | POA: Diagnosis present

## 2022-02-01 DIAGNOSIS — E440 Moderate protein-calorie malnutrition: Secondary | ICD-10-CM | POA: Diagnosis not present

## 2022-02-01 DIAGNOSIS — F0390 Unspecified dementia without behavioral disturbance: Secondary | ICD-10-CM

## 2022-02-01 DIAGNOSIS — R404 Transient alteration of awareness: Secondary | ICD-10-CM | POA: Diagnosis not present

## 2022-02-01 DIAGNOSIS — R000 Tachycardia, unspecified: Secondary | ICD-10-CM | POA: Diagnosis not present

## 2022-02-01 DIAGNOSIS — I959 Hypotension, unspecified: Secondary | ICD-10-CM | POA: Diagnosis not present

## 2022-02-01 DIAGNOSIS — I4892 Unspecified atrial flutter: Secondary | ICD-10-CM | POA: Diagnosis present

## 2022-02-01 DIAGNOSIS — I110 Hypertensive heart disease with heart failure: Principal | ICD-10-CM | POA: Diagnosis present

## 2022-02-01 DIAGNOSIS — I2510 Atherosclerotic heart disease of native coronary artery without angina pectoris: Secondary | ICD-10-CM | POA: Diagnosis present

## 2022-02-01 DIAGNOSIS — I5043 Acute on chronic combined systolic (congestive) and diastolic (congestive) heart failure: Secondary | ICD-10-CM | POA: Diagnosis not present

## 2022-02-01 DIAGNOSIS — R079 Chest pain, unspecified: Secondary | ICD-10-CM | POA: Diagnosis not present

## 2022-02-01 DIAGNOSIS — R627 Adult failure to thrive: Secondary | ICD-10-CM | POA: Diagnosis present

## 2022-02-01 DIAGNOSIS — F03B18 Unspecified dementia, moderate, with other behavioral disturbance: Secondary | ICD-10-CM | POA: Diagnosis not present

## 2022-02-01 DIAGNOSIS — I5022 Chronic systolic (congestive) heart failure: Secondary | ICD-10-CM | POA: Diagnosis not present

## 2022-02-01 DIAGNOSIS — I10 Essential (primary) hypertension: Secondary | ICD-10-CM | POA: Diagnosis not present

## 2022-02-01 DIAGNOSIS — I252 Old myocardial infarction: Secondary | ICD-10-CM | POA: Diagnosis not present

## 2022-02-01 DIAGNOSIS — R52 Pain, unspecified: Secondary | ICD-10-CM | POA: Diagnosis not present

## 2022-02-01 HISTORY — DX: Hyperlipidemia, unspecified: E78.5

## 2022-02-01 HISTORY — DX: Unspecified dementia, unspecified severity, without behavioral disturbance, psychotic disturbance, mood disturbance, and anxiety: F03.90

## 2022-02-01 HISTORY — DX: Essential (primary) hypertension: I10

## 2022-02-01 LAB — COMPREHENSIVE METABOLIC PANEL
ALT: 32 U/L (ref 0–44)
AST: 33 U/L (ref 15–41)
Albumin: 4.2 g/dL (ref 3.5–5.0)
Alkaline Phosphatase: 92 U/L (ref 38–126)
Anion gap: 9 (ref 5–15)
BUN: 21 mg/dL (ref 8–23)
CO2: 26 mmol/L (ref 22–32)
Calcium: 8.7 mg/dL — ABNORMAL LOW (ref 8.9–10.3)
Chloride: 108 mmol/L (ref 98–111)
Creatinine, Ser: 0.96 mg/dL (ref 0.61–1.24)
GFR, Estimated: >60 mL/min (ref >60)
Glucose, Bld: 104 mg/dL — ABNORMAL HIGH (ref 70–99)
Potassium: 4 mmol/L (ref 3.5–5.1)
Sodium: 143 mmol/L (ref 135–145)
Total Bilirubin: 1.4 mg/dL — ABNORMAL HIGH (ref 0.3–1.2)
Total Protein: 7 g/dL (ref 6.5–8.1)

## 2022-02-01 LAB — BRAIN NATRIURETIC PEPTIDE: B Natriuretic Peptide: 1017 pg/mL — ABNORMAL HIGH (ref 0.0–100.0)

## 2022-02-01 LAB — TROPONIN I (HIGH SENSITIVITY)
Troponin I (High Sensitivity): 26 ng/L — ABNORMAL HIGH (ref <18)
Troponin I (High Sensitivity): 26 ng/L — ABNORMAL HIGH (ref <18)

## 2022-02-01 LAB — CBC WITH DIFFERENTIAL/PLATELET
Abs Immature Granulocytes: 0.02 10*3/uL (ref 0.00–0.07)
Basophils Absolute: 0.1 10*3/uL (ref 0.0–0.1)
Basophils Relative: 1 %
Eosinophils Absolute: 0.3 10*3/uL (ref 0.0–0.5)
Eosinophils Relative: 3 %
HCT: 43 % (ref 39.0–52.0)
Hemoglobin: 13.9 g/dL (ref 13.0–17.0)
Immature Granulocytes: 0 %
Lymphocytes Relative: 17 %
Lymphs Abs: 1.3 10*3/uL (ref 0.7–4.0)
MCH: 32.6 pg (ref 26.0–34.0)
MCHC: 32.3 g/dL (ref 30.0–36.0)
MCV: 100.9 fL — ABNORMAL HIGH (ref 80.0–100.0)
Monocytes Absolute: 0.7 10*3/uL (ref 0.1–1.0)
Monocytes Relative: 9 %
Neutro Abs: 5.6 10*3/uL (ref 1.7–7.7)
Neutrophils Relative %: 70 %
Platelets: 140 10*3/uL — ABNORMAL LOW (ref 150–400)
RBC: 4.26 MIL/uL (ref 4.22–5.81)
RDW: 15.4 % (ref 11.5–15.5)
WBC: 8 10*3/uL (ref 4.0–10.5)
nRBC: 0 % (ref 0.0–0.2)

## 2022-02-01 LAB — PROCALCITONIN: Procalcitonin: <0.1 ng/mL

## 2022-02-01 MED ORDER — ACETAMINOPHEN 650 MG RE SUPP: 650.0000 mg | Freq: Four times a day (QID) | RECTAL | Status: DC | PRN

## 2022-02-01 MED ORDER — ATORVASTATIN CALCIUM 20 MG PO TABS
20.0000 mg | ORAL_TABLET | Freq: Every evening | ORAL | Status: DC
Start: 2022-02-01 — End: 2022-02-11
  Administered 2022-02-01 – 2022-02-06 (×3): 20 mg via ORAL
  Filled 2022-02-01 (×3): qty 1

## 2022-02-01 MED ORDER — ISOSORBIDE MONONITRATE ER 30 MG PO TB24
30.0000 mg | ORAL_TABLET | Freq: Every day | ORAL | Status: DC
  Administered 2022-02-02 – 2022-02-06 (×4): 30 mg via ORAL
  Filled 2022-02-01 (×6): qty 1

## 2022-02-01 MED ORDER — ONDANSETRON HCL 4 MG/2ML IJ SOLN: 4.0000 mg | Freq: Four times a day (QID) | INTRAMUSCULAR | Status: DC | PRN

## 2022-02-01 MED ORDER — FUROSEMIDE 10 MG/ML IJ SOLN
40.0000 mg | Freq: Two times a day (BID) | INTRAMUSCULAR | Status: DC
  Administered 2022-02-02 – 2022-02-03 (×3): 40 mg via INTRAVENOUS
  Filled 2022-02-01 (×3): qty 4

## 2022-02-01 MED ORDER — LISINOPRIL 10 MG PO TABS: 10.0000 mg | ORAL_TABLET | Freq: Every day | ORAL | Status: DC

## 2022-02-01 MED ORDER — ONDANSETRON HCL 4 MG PO TABS: 4.0000 mg | ORAL_TABLET | Freq: Four times a day (QID) | ORAL | Status: DC | PRN

## 2022-02-01 MED ORDER — PANTOPRAZOLE SODIUM 40 MG PO TBEC
40.0000 mg | DELAYED_RELEASE_TABLET | Freq: Every day | ORAL | Status: DC
Start: 2022-02-02 — End: 2022-02-11
  Administered 2022-02-02 – 2022-02-06 (×4): 40 mg via ORAL
  Filled 2022-02-01 (×6): qty 1

## 2022-02-01 MED ORDER — METOPROLOL SUCCINATE ER 50 MG PO TB24: 50.0000 mg | ORAL_TABLET | Freq: Every day | ORAL | Status: DC

## 2022-02-01 MED ORDER — DILTIAZEM HCL 25 MG/5ML IV SOLN
10.0000 mg | Freq: Once | INTRAVENOUS | Status: AC
  Administered 2022-02-01: 10 mg via INTRAVENOUS
  Filled 2022-02-01: qty 5

## 2022-02-01 MED ORDER — DONEPEZIL HCL 5 MG PO TABS
10.0000 mg | ORAL_TABLET | Freq: Every day | ORAL | Status: DC
  Administered 2022-02-02 – 2022-02-04 (×3): 10 mg via ORAL
  Filled 2022-02-01 (×4): qty 2

## 2022-02-01 MED ORDER — APIXABAN 5 MG PO TABS
5.0000 mg | ORAL_TABLET | Freq: Two times a day (BID) | ORAL | Status: DC
  Administered 2022-02-01 – 2022-02-06 (×8): 5 mg via ORAL
  Filled 2022-02-01 (×10): qty 1

## 2022-02-01 MED ORDER — FUROSEMIDE 10 MG/ML IJ SOLN
80.0000 mg | Freq: Once | INTRAMUSCULAR | Status: AC
  Administered 2022-02-01: 80 mg via INTRAVENOUS
  Filled 2022-02-01: qty 8

## 2022-02-01 MED ORDER — POTASSIUM CHLORIDE CRYS ER 20 MEQ PO TBCR
20.0000 meq | EXTENDED_RELEASE_TABLET | Freq: Every day | ORAL | Status: DC
  Administered 2022-02-01 – 2022-02-03 (×3): 20 meq via ORAL
  Filled 2022-02-01 (×3): qty 1

## 2022-02-01 MED ORDER — DILTIAZEM HCL 25 MG/5ML IV SOLN
10.0000 mg | Freq: Once | INTRAVENOUS | Status: AC
Start: 2022-02-01 — End: 2022-02-01
  Administered 2022-02-01: 10 mg via INTRAVENOUS

## 2022-02-01 MED ORDER — SODIUM CHLORIDE 0.9 % IV BOLUS: 1000.0000 mL | Freq: Once | INTRAVENOUS | Status: DC

## 2022-02-01 MED ORDER — CARVEDILOL 6.25 MG PO TABS
6.2500 mg | ORAL_TABLET | Freq: Two times a day (BID) | ORAL | Status: DC
  Administered 2022-02-01: 6.25 mg via ORAL
  Filled 2022-02-01: qty 1

## 2022-02-01 MED ORDER — ACETAMINOPHEN 325 MG PO TABS
650.0000 mg | ORAL_TABLET | Freq: Four times a day (QID) | ORAL | Status: DC | PRN
  Administered 2022-02-02: 650 mg via ORAL
  Filled 2022-02-01: qty 2

## 2022-02-01 MED ORDER — SPIRONOLACTONE 25 MG PO TABS: 25.0000 mg | ORAL_TABLET | Freq: Every day | ORAL | Status: DC

## 2022-02-01 NOTE — ED Provider Notes (Signed)
----------------------------------------- ?  1:49 PM on 02/01/2022 ?----------------------------------------- ?I personally seen and evaluated the patient in conjunction with physician assistant Mardee Postin.  Patient's lab work has resulted showing significant BNP elevation slight troponin elevation.  Normal CBC and a reassuring chemistry.  After 10 mg of IV diltiazem patient's heart rate is now much better controlled around 90 bpm and what appears to be atrial flutter on the monitor.  Suspect fluid overload.  Does have 2+ pitting edema to lower extremities.  We will start the patient on IV Lasix and admit to the hospitalist service for CHF exacerbation leading to atrial flutter.  Patient agreeable to plan of care.  Patient has no complaints currently.  And overall well-appearing on my examination. ?  ?Harvest Dark, MD ?02/01/22 1350 ? ?

## 2022-02-01 NOTE — Assessment & Plan Note (Signed)
Patient given 80 mg of IV Lasix in the emergency room.  We will increase the patient's Coreg dose to 6.25 mg p.o. twice a day.  We will continue Lasix 40 mg IV twice daily.  Patient already on lisinopril and spironolactone.  Last echocardiogram last year showed an EF of 25 to 30%.  We will repeat an echocardiogram. ?

## 2022-02-01 NOTE — ED Triage Notes (Signed)
Patient was brought to the ED from [facility] for heart rate of 137 on EKG with a rhythm of supraventricular tachycardia. Son is at bedside. Patient is alert and oriented. ?

## 2022-02-01 NOTE — Consult Note (Signed)
[facility] Cardiology ? ?CARDIOLOGY CONSULT NOTE  ?Patient ID: ?[name] ?MRN: [id number] ?DOB/AGE: Aug 20, 1927 88 y.o. ? ?Admit date: 02/01/2022 ?Referring Physician Elverta ?Primary Physician [place] ?Primary Cardiologist Nehemiah Massed ?Reason for Consultation SVT/atrial flutter ? ?HPI: 88 year old gentleman referred for evaluation of SVT and atrial flutter.  Patient has a history of coronary arteries, status post PTCA 04/13/1990 at [facility].  He also has a history of PSVT, chronic systolic congestive heart failure, essential hypertension, and dementia.  Cording to his son, the patient has been experiencing worsening peripheral edema, went to see his primary physician today, where he was noted to be tachycardic and was sent to [facility] ED where initial ECG revealed SVT at a rate of 137 bpm.  The patient was given intravenous diltiazem, and converted to atrial flutter 90 to 100 bpm.  The patient denies chest pain or heart racing.  Admission labs were notable for trivial troponin 26 and 26.  BNP was elevated to 1017.  Most recent 2D echocardiogram 12/21/2020 revealed LVEF 25 to 30% with moderate mitral regurgitation. ? ?Review of systems complete and found to be negative unless listed above  ? ? ? ?Past Medical History:  ?Diagnosis Date  ? Dementia ([place])   ? Fall at home 04/30/2019  ? Fall at home 2 weeks ago  ? Hyperlipidemia   ? Hypertension   ? MI, old   ?  ?Past Surgical History:  ?Procedure Laterality Date  ? CORONARY ANGIOPLASTY WITH STENT PLACEMENT    ? hemmorroid    ? ROTATOR CUFF REPAIR    ?  ?(Not in a hospital admission) ? ?Social History  ? ?Socioeconomic History  ? Marital status: Married  ?  Spouse name: Not on file  ? Number of children: Not on file  ? Years of education: Not on file  ? Highest education level: Not on file  ?Occupational History  ? Not on file  ?Tobacco Use  ? Smoking status: Never  ? Smokeless tobacco: Never  ?Substance and Sexual Activity  ? Alcohol use: No  ? Drug use: Never  ? Sexual activity:  Not on file  ?Other Topics Concern  ? Not on file  ?Social History Narrative  ? Not on file  ? ?Social Determinants of Health  ? ?Financial Resource Strain: Not on file  ?Food Insecurity: Not on file  ?Transportation Needs: Not on file  ?Physical Activity: Not on file  ?Stress: Not on file  ?Social Connections: Not on file  ?Intimate Partner Violence: Not on file  ?  ?Family History  ?Problem Relation Age of Onset  ? Heart failure Mother   ? CAD Father   ?  ? ? ?Review of systems complete and found to be negative unless listed above  ? ? ? ? ?PHYSICAL EXAM ? ?General: Well developed, well nourished, in no acute distress ?HEENT:  Normocephalic and atramatic ?Neck:  No JVD.  ?Lungs: Clear bilaterally to auscultation and percussion. ?Heart: HRRR . Normal S1 and S2 without gallops or murmurs.  ?Abdomen: Bowel sounds are positive, abdomen soft and non-tender  ?Msk:  Back normal, normal gait. Normal strength and tone for age. ?Extremities: No clubbing, cyanosis or edema.   ?Neuro: Alert and oriented X 3. ?Psych:  Good affect, responds appropriately ? ?Labs: ?  ?Lab Results  ?Component Value Date  ? WBC 8.0 02/01/2022  ? HGB 13.9 02/01/2022  ? HCT 43.0 02/01/2022  ? MCV 100.9 (H) 02/01/2022  ? PLT 140 (L) 02/01/2022  ?  ?Recent  Labs  ?Lab 02/01/22 ?9381  ?NA 143  ?K 4.0  ?CL 108  ?CO2 26  ?BUN 21  ?CREATININE 0.96  ?CALCIUM 8.7*  ?PROT 7.0  ?BILITOT 1.4*  ?ALKPHOS 92  ?ALT 32  ?AST 33  ?GLUCOSE 104*  ? ?Lab Results  ?Component Value Date  ? TROPONINI <0.03 06/06/2018  ? No results found for: CHOL ?No results found for: HDL ?No results found for: [place] ?No results found for: TRIG ?No results found for: CHOLHDL ?No results found for: LDLDIRECT  ?  ?Radiology: DG Chest 2 View ? ?Result Date: 02/01/2022 ?CLINICAL DATA:  Chest pain EXAM: CHEST - 2 VIEW COMPARISON:  June 9 21. FINDINGS: Low lung volumes. Chronic prominent lung markings. Streaky right basilar opacities, favor atelectasis. No visible pneumothorax. Possible trace  right pleural effusion. Enlarged cardiac silhouette. Pulmonary vascular congestion. IMPRESSION: 1. Cardiomegaly and pulmonary vascular congestion. Possible trace right pleural effusion. 2. Streaky right basilar opacities, favor atelectasis over pneumonia and/or aspiration. 3. Chronically prominent lung markings. Electronically Signed   By: Margaretha Sheffield M.D.   On: 02/01/2022 12:51   ? ?EKG: Atrial flutter at 100 bpm ? ?ASSESSMENT AND PLAN:  ? ?1.  PSVT converted to atrial flutter with diltiazem bolus ?2.  Atrial flutter, 90 to 100 bpm, on carvedilol for rate control, started on Eliquis for stroke prevention ?3.  Acute on chronic congestive heart failure, HFrEF, EF 25 to 30% by echo 12/21/2020, started on furosemide 40 mg IV twice daily, on carvedilol, lisinopril, and spironolactone ?4.  Essential hypertension, blood pressure adequately controlled on home BP medications ?5.  Dementia ? ?Recommendations ? ?1.  Agree with current therapy ?2.  Continue diuresis ?3.  Carefully monitor renal status ?4.  Agree with Eliquis for stroke prevention ?5.  Continue carvedilol, uptitrate as needed for rate control ?6.  Review 2D echocardiogram ?7.  DNR ? ?Signed: ?Isaias Cowman MD,PhD, FACC ?02/01/2022, 4:06 PM ? ? ? ? ?  ?

## 2022-02-01 NOTE — ED Provider Notes (Signed)
? ?[facility] ?Provider Note ? ? ? Event Date/Time  ? First MD Initiated Contact with Patient 02/01/22 1151   ?  (approximate) ? ? ?History  ? ?Chief Complaint ?Tachycardia ? ? ?HPI ?[name] is a 88 y.o. male, history of prior MI, hypercholesterolemia, systolic heart failure, hypertension, moderate mitral insufficiency/tricuspid insufficiency, late onset Alzheimer's, thrombocytopenia, presents to the emergency department for evaluation of leg swelling and tachycardia.  Patient states that he has been experiencing leg swelling for the past week or so.  When he went to [place] clinic today, they noticed that he had a heart rate of 137, suspected SVT.  He was referred to the emergency department for further evaluation and management.  Spoke with the patient's son, who corroborated the patient's history.  Denies fever/chills, chest pain, shortness of breath, lightheadedness/dizziness, urinary symptoms, headache, rashes, or nausea/vomiting.  ? ?History Limitations: History difficult to obtain from patient due to underlying Alzheimer's. ? ?  ? ? ?Physical Exam  ?Triage Vital Signs: ?ED Triage Vitals  ?Enc Vitals Group  ?   BP 02/01/22 1142 115/89  ?   Pulse Rate 02/01/22 1142 (!) 137  ?   Resp 02/01/22 1142 20  ?   Temp 02/01/22 1142 97.7 ?F (36.5 ?C)  ?   Temp Source 02/01/22 1142 Oral  ?   SpO2 02/01/22 1142 92 %  ?   Weight 02/01/22 1144 174 lb (78.9 kg)  ?   Height 02/01/22 1144 '5\' 11"'$  (1.803 m)  ?   Head Circumference --   ?   Peak Flow --   ?   Pain Score 02/01/22 1143 0  ?   Pain Loc --   ?   Pain Edu? --   ?   Excl. in [place]? --   ? ? ?Most recent vital signs: ?Vitals:  ? 02/01/22 1832 02/01/22 1926  ?BP:  122/67  ?Pulse:  67  ?Resp: 19 20  ?Temp:  97.8 ?F (36.6 ?C)  ?SpO2:  94%  ? ? ?General: Awake, NAD.  Speaking in full sentences. ?Skin: Warm, dry.  ?CV: Good peripheral perfusion.  S1-S2 present, notably tachycardic. ?Resp: Normal effort.  Lung sounds clear bilaterally. ?Abd:  Soft, non-tender. No distention.  ?Neuro: At baseline. No gross neurological deficits.  ?Other: 1+ pitting edema in the lower extremities.  Pulse, motor, sensation intact bilaterally. No remarkable erythema or tenderness ? ?Physical Exam ? ? ? ?ED Results / Procedures / Treatments  ?Labs ?(all labs ordered are listed, but only abnormal results are displayed) ?Labs Reviewed  ?CBC WITH DIFFERENTIAL/PLATELET - Abnormal; Notable for the following components:  ?    Result Value  ? MCV 100.9 (*)   ? Platelets 140 (*)   ? All other components within normal limits  ?BRAIN NATRIURETIC PEPTIDE - Abnormal; Notable for the following components:  ? B Natriuretic Peptide 1,017.0 (*)   ? All other components within normal limits  ?COMPREHENSIVE METABOLIC PANEL - Abnormal; Notable for the following components:  ? Glucose, Bld 104 (*)   ? Calcium 8.7 (*)   ? Total Bilirubin 1.4 (*)   ? All other components within normal limits  ?TROPONIN I (HIGH SENSITIVITY) - Abnormal; Notable for the following components:  ? Troponin I (High Sensitivity) 26 (*)   ? All other components within normal limits  ?TROPONIN I (HIGH SENSITIVITY) - Abnormal; Notable for the following components:  ? Troponin I (High Sensitivity) 26 (*)   ? All other components within  normal limits  ?PROCALCITONIN  ?PROCALCITONIN  ?CBC  ?BASIC METABOLIC PANEL  ? ? ? ?EKG ?Initial EKG shows supraventricular tachycardia, narrow complex, rate of 136, ST segment changes, no AV blocks, no axis deviations. ? ?Second EKG following diltiazem shows atrial flutter, rate of 103, no ST segment changes, no axis deviations, no AV blocks, normal QRS interval. ? ? ?RADIOLOGY ? ?ED Provider Interpretation: I personally reviewed this chest x-ray, trace right pleural effusion based on my interpretation ? ?DG Chest 2 View ? ?Result Date: 02/01/2022 ?CLINICAL DATA:  Chest pain EXAM: CHEST - 2 VIEW COMPARISON:  June 9 21. FINDINGS: Low lung volumes. Chronic prominent lung markings. Streaky right  basilar opacities, favor atelectasis. No visible pneumothorax. Possible trace right pleural effusion. Enlarged cardiac silhouette. Pulmonary vascular congestion. IMPRESSION: 1. Cardiomegaly and pulmonary vascular congestion. Possible trace right pleural effusion. 2. Streaky right basilar opacities, favor atelectasis over pneumonia and/or aspiration. 3. Chronically prominent lung markings. Electronically Signed   By: Margaretha Sheffield M.D.   On: 02/01/2022 12:51   ? ?PROCEDURES: ? ?Critical Care performed: None. ? ?Procedures ? ? ? ?MEDICATIONS ORDERED IN ED: ?Medications  ?furosemide (LASIX) injection 40 mg (has no administration in time range)  ?potassium chloride SA (KLOR-CON M) CR tablet 20 mEq (20 mEq Oral Given 02/01/22 1717)  ?apixaban (ELIQUIS) tablet 5 mg (5 mg Oral Given 02/01/22 1717)  ?acetaminophen (TYLENOL) tablet 650 mg (has no administration in time range)  ?  Or  ?acetaminophen (TYLENOL) suppository 650 mg (has no administration in time range)  ?ondansetron (ZOFRAN) tablet 4 mg (has no administration in time range)  ?  Or  ?ondansetron (ZOFRAN) injection 4 mg (has no administration in time range)  ?atorvastatin (LIPITOR) tablet 20 mg (20 mg Oral Given 02/01/22 1717)  ?isosorbide mononitrate (IMDUR) 24 hr tablet 30 mg (has no administration in time range)  ?lisinopril (ZESTRIL) tablet 10 mg (has no administration in time range)  ?spironolactone (ALDACTONE) tablet 25 mg (has no administration in time range)  ?carvedilol (COREG) tablet 6.25 mg (6.25 mg Oral Given 02/01/22 1717)  ?donepezil (ARICEPT) tablet 10 mg (has no administration in time range)  ?pantoprazole (PROTONIX) EC tablet 40 mg (has no administration in time range)  ?diltiazem (CARDIZEM) injection 10 mg (10 mg Intravenous Given 02/01/22 1218)  ?furosemide (LASIX) injection 80 mg (80 mg Intravenous Given 02/01/22 1411)  ?diltiazem (CARDIZEM) injection 10 mg (10 mg Intravenous Given 02/01/22 1826)  ? ? ? ?IMPRESSION / MDM / ASSESSMENT AND PLAN / ED  COURSE  ?I reviewed the triage vital signs and the nursing notes. ?             ?               ? ? ?Differential diagnosis includes, but is not limited to, SVT, atrial fibrillation, atrial flutter, CHF exacerbation, ACS. ? ?ED Course ?Patient appears well.  Notably tachycardic at 137, otherwise normal vitals.  NAD.  We will go ahead initiate diltiazem. ? ?On reexamination following diltiazem, patient's heart rate is now 103, noticeable atrial flutter on EKG.  No ST segment changes. ? ?CBC shows no evidence of leukocytosis or anemia. ? ?CMP unremarkable for electrolyte abnormalities, transaminitis, or kidney injury ? ?BNP notably elevated at 1017.  Unclear baseline.  Suggestive of heart failure, consistent with patient's history. ? ?Initial troponin 26.  Second troponin pending. ? ?Assessment/Plan ?Presentation consistent with atrial flutter with RVR.  Rate has been controlled with IV diltiazem.  Patient does have evidence of volume  overload given 1+ pitting edema in lower extremities, as well as trace pleural effusion on lungs.  Initiated 80 mg furosemide.  We will plan to admit this patient for further evaluation and management.  Spoke with the hospitalist, who agreed to admission ? ? ?  ? ? ?FINAL CLINICAL IMPRESSION(S) / ED DIAGNOSES  ? ?Final diagnoses:  ?SVT (supraventricular tachycardia) ([place])  ?Atrial flutter, unspecified type ([place])  ? ? ? ?Rx / DC Orders  ? ?ED Discharge Orders   ? ?      Ordered  ?  Amb Referral to HF Clinic       ? 02/01/22 1525  ?  Amb referral to AFIB Clinic       ? 02/01/22 1525  ? ?  ?  ? ?  ? ? ? ?Note:  This document was prepared using Dragon voice recognition software and may include unintentional dictation errors. ?  ?Teodoro Spray, [place] ?02/01/22 2008 ? ?  ?Harvest Dark, MD ?02/02/22 2324 ? ?

## 2022-02-01 NOTE — H&P (Signed)
?History and Physical  ? ? ?Patient: [name] WEX:[id number] DOB: 1927/09/23 ?DOA: 02/01/2022 ?DOS: the patient was seen and examined on 02/01/2022 ?PCP: Dion Body, MD  ?Patient coming from: Home ? ?Chief Complaint:  ?Chief Complaint  ?Patient presents with  ? Tachycardia  ? ?HPI: [name] is a 88 y.o. male with medical history significant of dementia, CAD, hypertension, hyperlipidemia.  The patient went into the primary care physician's office with increased swelling of his lower extremities.  He was found to have a heart rate of 140 and referred over to the emergency room.  In the emergency room here initially was in SVT and was given IV diltiazem.  Patient converted over to atrial flutter.  Patient currently feels fine.  Heard an audible wheeze.  The patient has dementia and is not the best historian.  Son at the bedside able to help out with history. ? ?Review of Systems: Review of Systems  ?Constitutional:  Negative for chills and fever.  ?HENT:  Negative for sore throat.   ?Eyes:  Negative for blurred vision.  ?Respiratory:  Negative for cough and shortness of breath.   ?Cardiovascular:  Negative for chest pain.  ?Gastrointestinal:  Negative for abdominal pain, nausea and vomiting.  ?Genitourinary:  Negative for dysuria.  ?Musculoskeletal:  Negative for joint pain.  ?Skin:  Negative for rash.  ?Neurological:  Negative for headaches.  ?Psychiatric/Behavioral:  Negative for depression.    ?Past Medical History:  ?Diagnosis Date  ? Dementia ([place])   ? Fall at home 04/30/2019  ? Fall at home 2 weeks ago  ? Hyperlipidemia   ? Hypertension   ? MI, old   ? ?Past Surgical History:  ?Procedure Laterality Date  ? CORONARY ANGIOPLASTY WITH STENT PLACEMENT    ? hemmorroid    ? ROTATOR CUFF REPAIR    ? ?Social History:  reports that he has never smoked. He has never used smokeless tobacco. He reports that he does not drink alcohol and does not use drugs. ? ?No Known Allergies ? ?Family History   ?Problem Relation Age of Onset  ? Heart failure Mother   ? CAD Father   ? ? ?Prior to Admission medications   ?Medication Sig Start Date End Date Taking? Authorizing Provider  ?aspirin 81 MG tablet Take 81 mg by mouth daily.   Yes [provider]  ?carvedilol (COREG) 3.125 MG tablet Take 3.125 mg by mouth 2 (two) times daily with a meal.   Yes [provider]  ?hydrochlorothiazide (HYDRODIURIL) 12.5 MG tablet Take 12.5 mg by mouth daily. 01/23/21  Yes [provider]  ?spironolactone (ALDACTONE) 25 MG tablet Take 25 mg by mouth daily.   Yes [provider]  ?acetaminophen (TYLENOL) 500 MG tablet Take 500 mg by mouth every 6 (six) hours as needed for mild pain.     [provider]  ?atorvastatin (LIPITOR) 20 MG tablet Take 20 mg by mouth daily.    [provider]  ?donepezil (ARICEPT) 10 MG tablet Take 10 mg by mouth daily. 12/14/19   [provider]  ?isosorbide mononitrate (IMDUR) 30 MG 24 hr tablet Take 30 mg by mouth daily. 01/19/20   [provider]  ?Lactobacillus Probiotic TABS Take by mouth.    [provider]  ?lisinopril (PRINIVIL,ZESTRIL) 10 MG tablet Take 10 mg by mouth daily.    [provider]  ?nitroGLYCERIN (NITROSTAT) 0.4 MG SL tablet Place 1 tablet (0.4 mg total) under the tongue every 5 (  five) minutes as needed for chest pain. 06/06/18   Bettey Costa, MD  ?omeprazole (PRILOSEC) 20 MG capsule Take 20 mg by mouth daily.    [provider]  ?Medication reconciliation undergoing ? ?Physical Exam: ?Vitals:  ? 02/01/22 1313 02/01/22 1330 02/01/22 1400 02/01/22 1411  ?BP:  (!) 82/68 120/79 127/82  ?Pulse: 90 84  (!) 103  ?Resp: '17 17  20  '$ ?Temp:      ?TempSrc:      ?SpO2:  95% 94% 95%  ?Weight:      ?Height:      ? ?Physical Exam ?HENT:  ?   Head: Normocephalic.  ?   Mouth/Throat:  ?   Pharynx: No oropharyngeal exudate.  ?Eyes:  ?   General: Lids are normal.  ?   Conjunctiva/sclera: Conjunctivae normal.   ?Cardiovascular:  ?   Rate and Rhythm: Normal rate. Rhythm irregularly irregular.  ?   Heart sounds: Normal heart sounds, S1 normal and S2 normal.  ?Pulmonary:  ?   Breath sounds: Examination of the right-middle field reveals rales. Examination of the left-middle field reveals rales. Examination of the right-lower field reveals rales. Examination of the left-lower field reveals rales. Rales present. No decreased breath sounds, wheezing or rhonchi.  ?Abdominal:  ?   Palpations: Abdomen is soft.  ?   Tenderness: There is no abdominal tenderness.  ?Musculoskeletal:  ?   Right lower leg: Swelling present.  ?   Left lower leg: Swelling present.  ?Skin: ?   General: Skin is warm.  ?   Findings: No rash.  ?Neurological:  ?   Mental Status: He is alert.  ?   Comments: Answers some questions but confused on some other questions.  ?  ?Data Reviewed: ?Creatinine 0.96, BNP 1017, 2 troponins 26, hemoglobin 13.9, white blood cell count 8.0, platelet count of 140. ?3 EKGs reviewed by me.  First EKG shows narrow complex SVT.  Next 2 EKGs show atrial flutter ? ?Assessment and Plan: ?1.  Acute on chronic systolic congestive heart failure.  The patient was started on Lasix 80 mg IV x1 in the emergency room.  I will increase Coreg to 6.25 mg twice a day.  Continue lisinopril and Aldactone.  We will continue 40 mg IV Lasix twice a day. ?2.  SVT and now atrial flutter.  With elevated [place] Vasc score, I will start Eliquis for now and see how he does.  Patient given 1 dose of Cardizem IV in the emergency room.  Will increase Coreg dose to control heart rate.  Removing fluid should also help. ?3.  Essential hypertension on Coreg, lisinopril, Aldactone and Lasix.  Discontinue hydrochlorothiazide. ?4.  Hyperlipidemia unspecified continue Lipitor ?5.  Patient is a DO NOT RESUSCITATE ?6.  Dementia.  On Aricept.  Watch closely for delirium. ? ? ?Advance Care Planning:   Code Status: DNR  ? ?Consults: Cardiology ? ?Family Communication: Son at  the bedside ? ?Severity of Illness: ?The appropriate patient status for this patient is INPATIENT. Inpatient status is judged to be reasonable and necessary in order to provide the required intensity of service to ensure the patient's safety. The patient's presenting symptoms, physical exam findings, and initial radiographic and laboratory data in the context of their chronic comorbidities is felt to place them at high risk for further clinical deterioration. Furthermore, it is not anticipated that the patient will be medically stable for discharge from the hospital within 2 midnights of admission.  ? ?* I certify that at  the point of admission it is my clinical judgment that the patient will require inpatient hospital care spanning beyond 2 midnights from the point of admission due to high intensity of service, high risk for further deterioration and high frequency of surveillance required.* ? ?Author: ?Loletha Grayer, MD ?02/01/2022 3:39 PM ? ?For on call review [email or web address].  ?

## 2022-02-02 ENCOUNTER — Inpatient Hospital Stay: Admit: 2022-02-02 | Payer: Medicare HMO

## 2022-02-02 DIAGNOSIS — I10 Essential (primary) hypertension: Secondary | ICD-10-CM | POA: Diagnosis not present

## 2022-02-02 DIAGNOSIS — I471 Supraventricular tachycardia: Secondary | ICD-10-CM | POA: Diagnosis not present

## 2022-02-02 DIAGNOSIS — I5023 Acute on chronic systolic (congestive) heart failure: Secondary | ICD-10-CM | POA: Diagnosis not present

## 2022-02-02 DIAGNOSIS — I4892 Unspecified atrial flutter: Secondary | ICD-10-CM | POA: Diagnosis not present

## 2022-02-02 LAB — CBC
HCT: 40.2 % (ref 39.0–52.0)
Hemoglobin: 13.1 g/dL (ref 13.0–17.0)
MCH: 32.4 pg (ref 26.0–34.0)
MCHC: 32.6 g/dL (ref 30.0–36.0)
MCV: 99.5 fL (ref 80.0–100.0)
Platelets: 129 10*3/uL — ABNORMAL LOW (ref 150–400)
RBC: 4.04 MIL/uL — ABNORMAL LOW (ref 4.22–5.81)
RDW: 15.2 % (ref 11.5–15.5)
WBC: 7 10*3/uL (ref 4.0–10.5)
nRBC: 0 % (ref 0.0–0.2)

## 2022-02-02 LAB — BASIC METABOLIC PANEL
Anion gap: 11 (ref 5–15)
BUN: 24 mg/dL — ABNORMAL HIGH (ref 8–23)
CO2: 26 mmol/L (ref 22–32)
Calcium: 8.8 mg/dL — ABNORMAL LOW (ref 8.9–10.3)
Chloride: 105 mmol/L (ref 98–111)
Creatinine, Ser: 0.84 mg/dL (ref 0.61–1.24)
GFR, Estimated: >60 mL/min (ref >60)
Glucose, Bld: 94 mg/dL (ref 70–99)
Potassium: 3.8 mmol/L (ref 3.5–5.1)
Sodium: 142 mmol/L (ref 135–145)

## 2022-02-02 LAB — PROCALCITONIN: Procalcitonin: <0.1 ng/mL

## 2022-02-02 MED ORDER — HALOPERIDOL LACTATE 5 MG/ML IJ SOLN
1.0000 mg | Freq: Four times a day (QID) | INTRAMUSCULAR | Status: DC | PRN
  Administered 2022-02-02 – 2022-02-04 (×4): 1 mg via INTRAVENOUS
  Filled 2022-02-02 (×4): qty 1

## 2022-02-02 MED ORDER — LISINOPRIL 5 MG PO TABS
2.5000 mg | ORAL_TABLET | Freq: Every day | ORAL | Status: DC
  Administered 2022-02-02: 2.5 mg via ORAL
  Filled 2022-02-02: qty 1

## 2022-02-02 MED ORDER — SPIRONOLACTONE 25 MG PO TABS
12.5000 mg | ORAL_TABLET | Freq: Every day | ORAL | Status: DC
  Administered 2022-02-02: 12.5 mg via ORAL
  Filled 2022-02-02: qty 1
  Filled 2022-02-02 (×2): qty 0.5

## 2022-02-02 MED ORDER — METOPROLOL TARTRATE 5 MG/5ML IV SOLN
5.0000 mg | INTRAVENOUS | Status: DC | PRN
  Administered 2022-02-03 – 2022-02-06 (×8): 5 mg via INTRAVENOUS
  Filled 2022-02-02 (×8): qty 5

## 2022-02-02 MED ORDER — CARVEDILOL 12.5 MG PO TABS
12.5000 mg | ORAL_TABLET | Freq: Two times a day (BID) | ORAL | Status: DC
  Administered 2022-02-02 – 2022-02-04 (×5): 12.5 mg via ORAL
  Filled 2022-02-02 (×5): qty 1

## 2022-02-02 NOTE — Consult Note (Signed)
[facility] Cardiology ? ?SUBJECTIVE: Patient laying in bed, appears clinically stable, denies chest pain or heart racing ? ? ?Vitals:  ? 02/02/22 0055 02/02/22 0351 02/02/22 0500 02/02/22 0745  ?BP: (!) 114/95  118/87 101/66  ?Pulse: (!) 131  (!) 115 86  ?Resp: '20  19 17  '$ ?Temp: 98 ?F (36.7 ?C)  98.5 ?F (36.9 ?C) 97.7 ?F (36.5 ?C)  ?TempSrc:   Oral Oral  ?SpO2: 97%  96% 94%  ?Weight:  73.7 kg    ?Height:  '5\' 11"'$  (1.803 m)    ? ? ? ?Intake/Output Summary (Last 24 hours) at 02/02/2022 0931 ?Last data filed at 02/02/2022 0710 ?Gross per 24 hour  ?Intake --  ?Output 2500 ml  ?Net -2500 ml  ? ? ? ? ?PHYSICAL EXAM ? ?General: Well developed, well nourished, in no acute distress ?HEENT:  Normocephalic and atramatic ?Neck:  No JVD.  ?Lungs: Clear bilaterally to auscultation and percussion. ?Heart: HRRR . Normal S1 and S2 without gallops or murmurs.  ?Abdomen: Bowel sounds are positive, abdomen soft and non-tender  ?Msk:  Back normal, normal gait. Normal strength and tone for age. ?Extremities: No clubbing, cyanosis or edema.   ?Neuro: Alert and oriented X 3. ?Psych:  Good affect, responds appropriately ? ? ?LABS: ?Basic Metabolic Panel: ?Recent Labs  ?  02/01/22 ?1157 02/02/22 ?0501  ?NA 143 142  ?K 4.0 3.8  ?CL 108 105  ?CO2 26 26  ?GLUCOSE 104* 94  ?BUN 21 24*  ?CREATININE 0.96 0.84  ?CALCIUM 8.7* 8.8*  ? ?Liver Function Tests: ?Recent Labs  ?  02/01/22 ?4650  ?AST 33  ?ALT 32  ?ALKPHOS 92  ?BILITOT 1.4*  ?PROT 7.0  ?ALBUMIN 4.2  ? ?No results for input(s): LIPASE, AMYLASE in the last 72 hours. ?CBC: ?Recent Labs  ?  02/01/22 ?1157 02/02/22 ?0501  ?WBC 8.0 7.0  ?NEUTROABS 5.6  --   ?HGB 13.9 13.1  ?HCT 43.0 40.2  ?MCV 100.9* 99.5  ?PLT 140* 129*  ? ?Cardiac Enzymes: ?No results for input(s): CKTOTAL, CKMB, CKMBINDEX, TROPONINI in the last 72 hours. ?BNP: ?Invalid input(s): POCBNP ?D-Dimer: ?No results for input(s): DDIMER in the last 72 hours. ?Hemoglobin A1C: ?No results for input(s): HGBA1C in the last 72 hours. ?Fasting Lipid  Panel: ?No results for input(s): CHOL, HDL, LDLCALC, TRIG, CHOLHDL, LDLDIRECT in the last 72 hours. ?Thyroid Function Tests: ?No results for input(s): TSH, T4TOTAL, T3FREE, THYROIDAB in the last 72 hours. ? ?Invalid input(s): FREET3 ?Anemia Panel: ?No results for input(s): VITAMINB12, FOLATE, FERRITIN, TIBC, IRON, RETICCTPCT in the last 72 hours. ? ?DG Chest 2 View ? ?Result Date: 02/01/2022 ?CLINICAL DATA:  Chest pain EXAM: CHEST - 2 VIEW COMPARISON:  June 9 21. FINDINGS: Low lung volumes. Chronic prominent lung markings. Streaky right basilar opacities, favor atelectasis. No visible pneumothorax. Possible trace right pleural effusion. Enlarged cardiac silhouette. Pulmonary vascular congestion. IMPRESSION: 1. Cardiomegaly and pulmonary vascular congestion. Possible trace right pleural effusion. 2. Streaky right basilar opacities, favor atelectasis over pneumonia and/or aspiration. 3. Chronically prominent lung markings. Electronically Signed   By: Margaretha Sheffield M.D.   On: 02/01/2022 12:51   ? ? ?Echo pending ? ?TELEMETRY: Atrial flutter 110 bpm: ? ?ASSESSMENT AND PLAN: ? ?Principal Problem: ?  CHF (congestive heart failure) ([place]) ?Active Problems: ?  Dementia ([place]) ?  Essential hypertension ?  Acute on chronic systolic CHF (congestive heart failure) ([place]) ?  ? ?1.  PSVT converted to atrial flutter with diltiazem bolus ?2.  Atrial flutter, 90  to 110 beats bpm, on carvedilol for rate control, started on Eliquis for stroke prevention ?3.  Acute on chronic congestive heart failure, HFrEF, EF 25 to 30% by echo 12/21/2020, started on furosemide 40 mg IV twice daily, check 24-hour output 2150 cc, on carvedilol, lisinopril, and spironolactone ?4.  Essential hypertension, blood pressure adequately controlled on home BP medications ?5.  Dementia ?  ?Recommendations ?  ?1.  Agree with current therapy ?2.  Continue diuresis ?3.  Carefully monitor renal status ?4.  Agree with Eliquis for stroke prevention ?5.  Continue  carvedilol, uptitrate as needed for rate control, target heart rate 100 bpm ?6.  Review 2D echocardiogram ?7.  DNR ? ? ?Isaias Cowman, MD, PhD, [facility] ?02/02/2022 ?9:31 AM ? ? ? ?  ?

## 2022-02-02 NOTE — Progress Notes (Signed)
Patient has been confused majority of this shift. He is combative and wants to get out of bed. Occasionally he is calm but then eventually wakes up and continues to be combative. He is reoriented and then calms down again/ ? ?Nurse Practitioner notified for an order for a sitter for this room. ?

## 2022-02-02 NOTE — Progress Notes (Signed)
Patient first became a yellow MEWS at midnight. VS unstable are BP and Pulse. Will continue to monitor vitals ? ? ? 02/02/22 0055  ?Vitals  ?Temp 98 ?F (36.7 ?C)  ?BP (!) 114/95  ?MAP (mmHg) 102  ?BP Location Right Arm  ?BP Method Automatic  ?Patient Position (if appropriate) Lying  ?Pulse Rate (!) 131  ?Resp 20  ?MEWS COLOR  ?MEWS Score Color Yellow  ?Oxygen Therapy  ?SpO2 97 %  ?O2 Device Room Air  ?MEWS Score  ?MEWS Temp 0  ?MEWS Systolic 0  ?MEWS Pulse 3  ?MEWS RR 0  ?MEWS LOC 0  ?MEWS Score 3  ? ? ?

## 2022-02-02 NOTE — Progress Notes (Signed)
?  Patient became a yellow MEWS at 00:55 and stayed a yellow MEWS till 5 am when vitals were taken again. See VS below ? ? 02/02/22 0500  ?Vitals  ?Temp 98.5 ?F (36.9 ?C)  ?Temp Source Oral  ?BP 118/87  ?MAP (mmHg) 65  ?BP Location Right Arm  ?BP Method Automatic  ?Patient Position (if appropriate) Lying  ?Pulse Rate (!) 115  ?Pulse Rate Source Monitor  ?Resp 19  ?Level of Consciousness  ?Level of Consciousness Alert  ?MEWS COLOR  ?MEWS Score Color Yellow  ?Oxygen Therapy  ?SpO2 96 %  ?O2 Device Room Air  ?Pain Assessment  ?Pain Scale 0-10  ?Pain Score 0  ?MEWS Score  ?MEWS Temp 0  ?MEWS Systolic 0  ?MEWS Pulse 2  ?MEWS RR 0  ?MEWS LOC 0  ?MEWS Score 2  ? ? ?

## 2022-02-02 NOTE — Progress Notes (Signed)
[facility] ?PROGRESS NOTE ? ?Ural Acree [id number] DOB: September 04, 1927 DOA: 02/01/2022 ?PCP: Dion Body, MD ? ?HPI/Subjective: ?Patient feels fine.  Offers no complaints.  Admitted yesterday with CHF and lower extremity edema and initially SVT and then atrial flutter on the heart monitor. ? ?Objective: ?Vitals:  ? 02/02/22 0745 02/02/22 1145  ?BP: 101/66 (!) 91/56  ?Pulse: 86 (!) 59  ?Resp: 17 18  ?Temp: 97.7 ?F (36.5 ?C) 97.8 ?F (36.6 ?C)  ?SpO2: 94% 100%  ? ? ?Intake/Output Summary (Last 24 hours) at 02/02/2022 1241 ?Last data filed at 02/02/2022 1045 ?Gross per 24 hour  ?Intake --  ?Output 2850 ml  ?Net -2850 ml  ? ?Filed Weights  ? 02/01/22 1144 02/01/22 1753 02/02/22 0351  ?Weight: 78.9 kg 75.2 kg 73.7 kg  ? ? ? ?Exam: ?Physical Exam ?HENT:  ?   Head: Normocephalic.  ?   Mouth/Throat:  ?   Pharynx: No oropharyngeal exudate.  ?Eyes:  ?   General: Lids are normal.  ?   Conjunctiva/sclera: Conjunctivae normal.  ?Cardiovascular:  ?   Rate and Rhythm: Tachycardia present. Rhythm irregularly irregular.  ?   Heart sounds: Normal heart sounds, S1 normal and S2 normal.  ?Pulmonary:  ?   Breath sounds: Examination of the right-lower field reveals decreased breath sounds and rales. Examination of the left-lower field reveals decreased breath sounds and rales. Decreased breath sounds and rales present. No wheezing or rhonchi.  ?Abdominal:  ?   Palpations: Abdomen is soft.  ?   Tenderness: There is no abdominal tenderness.  ?Musculoskeletal:  ?   Right lower leg: Swelling present.  ?   Left lower leg: Swelling present.  ?Skin: ?   General: Skin is warm.  ?   Findings: No rash.  ?Neurological:  ?   Mental Status: He is alert.  ?   Comments: Able to straight leg raise.  Answers simple questions appropriately.  ? ? ? ? ?Scheduled Meds: ? apixaban  5 mg Oral BID  ? atorvastatin  20 mg Oral QPM  ? carvedilol  12.5 mg Oral BID WC  ? donepezil  10 mg Oral Daily  ? furosemide  40 mg Intravenous BID  ? isosorbide  mononitrate  30 mg Oral Daily  ? lisinopril  2.5 mg Oral Daily  ? pantoprazole  40 mg Oral Daily  ? potassium chloride  20 mEq Oral Daily  ? spironolactone  12.5 mg Oral Daily  ? ? ? ?Assessment/Plan: ? ?Acute on chronic systolic congestive heart failure.  Outpatient echocardiogram showed an EF of 25 to 30%.  Repeat echo.  Continue IV Lasix 40 mg IV twice a day.  Continue Coreg, lisinopril and spironolactone if blood pressure allows. ?Paroxysmal SVT converted over to atrial flutter after diltiazem bolus. ?Atrial flutter.  Heart rate this morning was 135.  Increase Coreg to 12.5 mg twice a day.  May need to cut back on the Coreg dose with relative hypotension. ?Relative hypotension with history of essential hypertension.  I cut back on lisinopril and spironolactone doses with going up on Coreg.  May have to cut back even further. ?Dementia without behavioral disturbance on Aricept.  Continue to watch closely for delirium ?Patient is a DNR ? ?  ? ? ?  ?Code Status:  ? ?  ?Code Status Orders  ?(From admission, onward)  ?  ? ? ?  ? ?  Start     Ordered  ? 02/01/22 1525  Do not attempt resuscitation (DNR)  Continuous       ?Question Answer Comment  ?In the event of cardiac or respiratory ARREST Do not call a ?code blue?   ?In the event of cardiac or respiratory ARREST Do not perform Intubation, CPR, defibrillation or ACLS   ?In the event of cardiac or respiratory ARREST Use medication by any route, position, wound care, and other measures to relive pain and suffering. May use oxygen, suction and manual treatment of airway obstruction as needed for comfort.   ?Comments nurse may pronounce   ?  ? 02/01/22 1525  ? ?  ?  ? ?  ? ?Code Status History   ? ? Date Active Date Inactive Code Status Order ID Comments User Context  ? 02/21/2020 1913 02/22/2020 2035 DNR [id number]  Christel Mormon, MD ED  ? 06/05/2018 2015 06/06/2018 1457 DNR [id number]  Mayo, Pete Pelt, MD ED  ? ?  ? ?Advance Directive Documentation   ? ?Flowsheet Row Most  Recent Value  ?Type of Advance Directive Healthcare Power of Attorney  ?Pre-existing out of facility DNR order (yellow form or pink MOST form) --  ?"MOST" Form in Place? --  ? ?  ? ?Family Communication: Spoke with son at bedside ?Disposition Plan: Status is: Inpatient ?Still requires IV diuresis with IV Lasix for CHF.  Still trying to adjust medications to control heart rate.  Balancing that with relative hypotension. ?Disposition will be home ?  ? ?Consultants: ?Cardiology ? ?[facility]  ?[facility] ? ? ? ? ? ? ? ? ? ? ?  ?

## 2022-02-02 NOTE — Progress Notes (Signed)
Patient has been a Yellow MEWS for part of this shift. Vital Signs checked. Pulse remains high but will continue to monitor. Patient is stable at the moment but was confused for part of the night shift. ? ?Yellow MEWS protocol note was made to address the Vital signs. The patient is occasionally restless but is calm once reoriented.  ? ?A safety observation order was requested and placed by the Nurse practitioner. ?

## 2022-02-02 NOTE — Evaluation (Signed)
Physical Therapy Evaluation ?Patient Details ?Name: [name] ?MRN: [id number] ?DOB: 17-Feb-1927 ?Today's Date: 02/02/2022 ? ?History of Present Illness ? Pt is a 88 y/o M admitted on 02/01/22 after presenting to his PCP with c/o BLE edema & found to have HR in the 140s & was referred to the ER. In the ER pt was initially in SVT, was treated & converted to a-flutter. Pt is being treated for acute on chronic CHF. PMH: dementia, CAD, HTN, HLD, MI  ?Clinical Impression ? Pt seen for PT evaluation after clearance from attending MD as MD was made aware of pt's elevated HR at rest (134 bpm) but requested to see what HR was with ambulation. Pt received in bed with sitter present & pt able to transfer to sitting EOB with reliance on bed rails & HOB elevated with min assist. Pt initially provided with [facility] as that's what he uses at home but required min assist for STS & held to [facility] & any other object available in room with other UE for support so PT provided pt with RW. Pt was able to ambulate 1 lap around nurses station with RW & min assist with poor awareness of safe use of AD as pt pushed it out in front of him. Pt is limited by impaired cognition (oriented to self only) & HOH without hearing aides present. Contacted pt's son via telephone who reports pt falls very frequently at home with PT educating him on recommendation for pt to use RW vs SPC (unsure if pt will be able to do this at home as son reports house is very small) & f/u HHPT. Will continue to follow pt acutely to address balance, gait with LRAD & stair negotiation. ? ?HR 134 bpm at rest in bed, max HR of 136 bpm with gait & MD notified   ?   ? ?Recommendations for follow up therapy are one component of a multi-disciplinary discharge planning process, led by the attending physician.  Recommendations may be updated based on patient status, additional functional criteria and insurance authorization. ? ?Follow Up Recommendations Home health PT ? ?  ?Assistance  Recommended at Discharge Frequent or constant Supervision/Assistance  ?Patient can return home with the following ? A little help with walking and/or transfers;Assistance with cooking/housework;A little help with bathing/dressing/bathroom;Assist for transportation;Assistance with feeding;Direct supervision/assist for financial management;Direct supervision/assist for medications management;Help with stairs or ramp for entrance ? ?  ?Equipment Recommendations None recommended by PT (pt has RW at home)  ?Recommendations for Other Services ?    ?  ?Functional Status Assessment Patient has had a recent decline in their functional status and demonstrates the ability to make significant improvements in function in a reasonable and predictable amount of time.  ? ?  ?Precautions / Restrictions Precautions ?Precautions: Fall ?Restrictions ?Weight Bearing Restrictions: No  ? ?  ? ?Mobility ? Bed Mobility ?Overal bed mobility: Needs Assistance ?Bed Mobility: Supine to Sit ?  ?  ?Supine to sit: Min assist, HOB elevated ?  ?  ?General bed mobility comments: use of bed rails, extra time ?  ? ?Transfers ?Overall transfer level: Needs assistance ?Equipment used: Straight cane ?Transfers: Sit to/from Stand ?Sit to Stand: Min assist ?  ?  ?  ?  ?  ?  ?  ? ?Ambulation/Gait ?Ambulation/Gait assistance: Min assist ?Gait Distance (Feet): 165 Feet ?Assistive device: Rolling walker (2 wheels) ?Gait Pattern/deviations: Decreased step length - right, Decreased step length - left, Decreased dorsiflexion - right, Decreased dorsiflexion -  left, Decreased stride length, Trunk flexed ?Gait velocity: slightly decreased ?  ?  ?General Gait Details: Pt initially attempts to ambulate with SPC & min assist but pt holding to objects at all times with other UE (counter, bed, etc) so PT provided pt with RW for increased balance & safety. Pt pushes RW too far out in front & demonstrates decreased awareness of proper use of AD during gait. ? ?Stairs ?  ?   ?  ?  ?  ? ?Wheelchair Mobility ?  ? ?Modified Rankin (Stroke Patients Only) ?  ? ?  ? ?Balance Overall balance assessment: Needs assistance ?Sitting-balance support: Feet supported, Bilateral upper extremity supported ?Sitting balance-Leahy Scale: Good ?Sitting balance - Comments: Pt is able to retreive shoes from floor & don them sitting EOB with supervision without overt LOB. ?  ?Standing balance support: During functional activity, Bilateral upper extremity supported, Reliant on assistive device for balance ?Standing balance-Leahy Scale: Fair ?Standing balance comment: has to have BUE support for standing/gait ?  ?  ?  ?  ?  ?  ?  ?  ?  ?  ?  ?   ? ? ? ?Pertinent Vitals/Pain Pain Assessment ?Pain Assessment: Faces ?Faces Pain Scale: No hurt  ? ? ?Home Living Family/patient expects to be discharged to:: Private residence ?Living Arrangements: Spouse/significant other ?Available Help at Discharge: Family ?Type of Home: House ?Home Access: Stairs to enter ?Entrance Stairs-Rails: Right;Left ?Entrance Stairs-Number of Steps: 2 at front, 4 at back, both with B rails ?  ?Home Layout: One level ?Home Equipment: Tub bench;Rolling Walker (2 wheels);Cane - single point ?Additional Comments: Pt lives with wife & son reports she is in the early stages of Alzheimer's as well.  ?  ?Prior Function   ?  ?  ?  ?  ?  ?  ?Mobility Comments: Pt is ambulatory with [facility] & furniture walking, son reports pt falls frequently, with "too many to count" in the past 6 months (notes pt had to have stitches in head 2/2 last fall). Son provides supervision<>min assist for stair negotiation as pt negotiates them with [facility] & rail. ?ADLs Comments: Son reports pt likes to try to bathe & dress himself without assistance ?  ? ? ?Hand Dominance  ?   ? ?  ?Extremity/Trunk Assessment  ? Upper Extremity Assessment ?Upper Extremity Assessment: Generalized weakness;Difficult to assess due to impaired cognition ?  ? ?Lower Extremity Assessment ?Lower  Extremity Assessment: Generalized weakness;Difficult to assess due to impaired cognition ?  ? ?Cervical / Trunk Assessment ?Cervical / Trunk Assessment: Kyphotic  ?Communication  ? Communication: HOH (doesn't have hearing aides with him)  ?Cognition Arousal/Alertness: Awake/alert ?Behavior During Therapy: [facility] for tasks assessed/performed ?Overall Cognitive Status: History of cognitive impairments - at baseline ?  ?  ?  ?  ?  ?  ?  ?  ?  ?  ?  ?  ?  ?  ?  ?  ?General Comments: Pt oriented to self, follows simple commands with extra time, limited by [facility] ?  ?  ? ?  ?General Comments General comments (skin integrity, edema, etc.): Contacted pt's son Rip Harbour "[name]" via telephone re: pt's PLOF & home set up ? ?  ?Exercises    ? ?Assessment/Plan  ?  ?PT Assessment Patient needs continued PT services  ?PT Problem List Decreased strength;Decreased mobility;Decreased safety awareness;Decreased activity tolerance;Decreased balance;Decreased knowledge of use of DME;Cardiopulmonary status limiting activity ? ?   ?  ?PT Treatment Interventions DME  instruction;Therapeutic exercise;Gait training;Balance training;Stair training;Neuromuscular re-education;Functional mobility training;Therapeutic activities;Patient/family education   ? ?PT Goals (Current goals can be found in the Care Plan section)  ?Acute Rehab PT Goals ?Patient Stated Goal: get better ?PT Goal Formulation: With family ?Time For Goal Achievement: 02/16/22 ?Potential to Achieve Goals: Fair ? ?  ?Frequency Min 2X/week ?  ? ? ?Co-evaluation   ?  ?  ?  ?  ? ? ?  ?AM-PAC PT "6 Clicks" Mobility  ?Outcome Measure Help needed turning from your back to your side while in a flat bed without using bedrails?: A Little ?Help needed moving from lying on your back to sitting on the side of a flat bed without using bedrails?: A Little ?Help needed moving to and from a bed to a chair (including a wheelchair)?: A Little ?Help needed standing up from a chair using your arms (e.g.,  wheelchair or bedside chair)?: A Little ?Help needed to walk in hospital room?: A Little ?Help needed climbing 3-5 steps with a railing? : A Lot ?6 Click Score: 17 ? ?  ?End of Session Equipment Utilized During Tr

## 2022-02-03 ENCOUNTER — Inpatient Hospital Stay
Admit: 2022-02-03 | Discharge: 2022-02-03 | Disposition: A | Discharge status: Home / Self Care | Payer: Medicare HMO | Attending: Internal Medicine | Admitting: Internal Medicine

## 2022-02-03 DIAGNOSIS — F03B18 Unspecified dementia, moderate, with other behavioral disturbance: Secondary | ICD-10-CM

## 2022-02-03 DIAGNOSIS — E876 Hypokalemia: Secondary | ICD-10-CM

## 2022-02-03 DIAGNOSIS — I4892 Unspecified atrial flutter: Secondary | ICD-10-CM | POA: Diagnosis not present

## 2022-02-03 DIAGNOSIS — I5023 Acute on chronic systolic (congestive) heart failure: Secondary | ICD-10-CM | POA: Diagnosis not present

## 2022-02-03 DIAGNOSIS — I471 Supraventricular tachycardia: Secondary | ICD-10-CM | POA: Diagnosis not present

## 2022-02-03 LAB — BASIC METABOLIC PANEL
Anion gap: 11 (ref 5–15)
BUN: 23 mg/dL (ref 8–23)
CO2: 32 mmol/L (ref 22–32)
Calcium: 9 mg/dL (ref 8.9–10.3)
Chloride: 98 mmol/L (ref 98–111)
Creatinine, Ser: 1.06 mg/dL (ref 0.61–1.24)
GFR, Estimated: >60 mL/min (ref >60)
Glucose, Bld: 94 mg/dL (ref 70–99)
Potassium: 3.3 mmol/L — ABNORMAL LOW (ref 3.5–5.1)
Sodium: 141 mmol/L (ref 135–145)

## 2022-02-03 LAB — ECHOCARDIOGRAM COMPLETE
Area-P 1/2: 10.92 cm2
Calc EF: 21.7 %
Height: 71 in
MV M vel: 3.1 m/s
MV Peak grad: 38.4 mmHg
Radius: 0.7 cm
S' Lateral: 5.15 cm
Single Plane A2C EF: 24.8 %
Single Plane A4C EF: 18.4 %
Weight: 2405.66 oz

## 2022-02-03 LAB — PROCALCITONIN: Procalcitonin: <0.1 ng/mL

## 2022-02-03 MED ORDER — SPIRONOLACTONE 25 MG PO TABS
12.5000 mg | ORAL_TABLET | Freq: Every day | ORAL | Status: DC
  Filled 2022-02-03: qty 0.5

## 2022-02-03 MED ORDER — LISINOPRIL 5 MG PO TABS: 2.5000 mg | ORAL_TABLET | Freq: Every day | ORAL | Status: DC

## 2022-02-03 MED ORDER — TORSEMIDE 20 MG PO TABS: 20.0000 mg | ORAL_TABLET | Freq: Every day | ORAL | Status: DC

## 2022-02-03 NOTE — Progress Notes (Signed)
*  PRELIMINARY RESULTS* ?Echocardiogram ?2D Echocardiogram has been performed. ? ?[name] ?02/03/2022, 1:30 PM ?

## 2022-02-03 NOTE — Progress Notes (Incomplete)
Assumed care of pt at 1900. Alert to self. 1:1 Safety attendant at bedside for impulsiveness/confusion. Pt not following commands overnight, getting oob and wandering around the room. Frequent reorientation and redirection needed. Full assessment as documented. Medication administration per MAR. Pt resting comfortably in bed at this time. Comfort and safety maintained.  ?

## 2022-02-03 NOTE — Progress Notes (Signed)
[facility] ?PROGRESS NOTE ? ?[name] [id number]:[id number] DOB: [birth year] DOA: 02/01/2022 ?PCP: Dion Body, MD ? ?HPI/Subjective: ?Patient was talking about the past where he was serving his country during the [place] War.  Patient also tells me that he lost a son back at age 88 in the 26s.  He feels okay.  Offers no complaints.  Admitted with CHF and rapid atrial flutter.  This morning his heart rate was in the 130s. ? ?Objective: ?Vitals:  ? 02/03/22 0028 02/03/22 0416  ?BP: (!) 111/97 113/69  ?Pulse: (!) 56 81  ?Resp: 20 18  ?Temp: 98.1 ?F (36.7 ?C) 98.6 ?F (37 ?C)  ?SpO2: 95% 100%  ? ? ?Intake/Output Summary (Last 24 hours) at 02/03/2022 1259 ?Last data filed at 02/03/2022 0334 ?Gross per 24 hour  ?Intake 120 ml  ?Output 2650 ml  ?Net -2530 ml  ? ?Filed Weights  ? 02/01/22 1753 02/02/22 0351 02/03/22 0429  ?Weight: 75.2 kg 73.7 kg 68.2 kg  ? ? ?Exam: ?Physical Exam ?HENT:  ?   Head: Normocephalic.  ?   Mouth/Throat:  ?   Pharynx: No oropharyngeal exudate.  ?Eyes:  ?   General: Lids are normal.  ?   Conjunctiva/sclera: Conjunctivae normal.  ?Cardiovascular:  ?   Rate and Rhythm: Tachycardia present. Rhythm irregularly irregular.  ?   Heart sounds: Normal heart sounds, S1 normal and S2 normal.  ?Pulmonary:  ?   Breath sounds: Examination of the right-lower field reveals decreased breath sounds and rales. Examination of the left-lower field reveals decreased breath sounds and rales. Decreased breath sounds and rales present. No wheezing or rhonchi.  ?Abdominal:  ?   Palpations: Abdomen is soft.  ?   Tenderness: There is no abdominal tenderness.  ?Musculoskeletal:  ?   Right lower leg: Swelling present.  ?   Left lower leg: Swelling present.  ?Skin: ?   General: Skin is warm.  ?   Findings: No rash.  ?Neurological:  ?   Mental Status: He is alert.  ?   Comments: Able to straight leg raise.  Answers simple questions appropriately.  ? ? ? ? ?Scheduled Meds: ? apixaban  5 mg Oral BID  ? atorvastatin  20  mg Oral QPM  ? carvedilol  12.5 mg Oral BID WC  ? donepezil  10 mg Oral Daily  ? isosorbide mononitrate  30 mg Oral Daily  ? [START ON 02/04/2022] lisinopril  2.5 mg Oral Daily  ? pantoprazole  40 mg Oral Daily  ? potassium chloride  20 mEq Oral Daily  ? [START ON 02/04/2022] spironolactone  12.5 mg Oral Daily  ? [START ON 02/04/2022] torsemide  20 mg Oral Daily  ? ? ? ?Assessment/Plan: ? ?Acute on chronic systolic congestive heart failure.  Current echocardiogram pending.  Last echocardiogram as outpatient showed an EF of 25 to 30%.  Switch IV Lasix over to torsemide for tomorrow with creatinine rise.  Continue Coreg.  Hold lisinopril and spironolactone this morning and restart tomorrow. ?Rapid atrial flutter.  Heart rate this morning in the 130s also.  Continue Coreg 12.5 mg twice a day.  Patient refused yesterday afternoon's Coreg dose so likely because an elevated heart rate today.  Eliquis for anticoagulation ?Paroxysmal SVT ?Essential hypertension.  With going up on Coreg I cut back on lisinopril and spironolactone doses but holding today and will restart tomorrow. ?Dementia with sundowning in the afternoons.  Continue Aricept.  Spoke with nursing staff that he must get his afternoon Coreg  dose. ?Increasing creatinine to 1.06 today.  Hold Lasix this afternoon and convert over to torsemide for tomorrow morning. ?Hypokalemia replace potassium today ? ? ?  ?Code Status:  ? ?  ?Code Status Orders  ?(From admission, onward)  ?  ? ? ?  ? ?  Start     Ordered  ? 02/01/22 1525  Do not attempt resuscitation (DNR)  Continuous       ?Question Answer Comment  ?In the event of cardiac or respiratory ARREST Do not call a ?code blue?   ?In the event of cardiac or respiratory ARREST Do not perform Intubation, CPR, defibrillation or ACLS   ?In the event of cardiac or respiratory ARREST Use medication by any route, position, wound care, and other measures to relive pain and suffering. May use oxygen, suction and manual treatment of  airway obstruction as needed for comfort.   ?Comments nurse may pronounce   ?  ? 02/01/22 1525  ? ?  ?  ? ?  ? ?Code Status History   ? ? Date Active Date Inactive Code Status Order ID Comments User Context  ? 02/21/2020 1913 02/22/2020 2035 DNR [id number]  Christel Mormon, MD ED  ? 06/05/2018 2015 06/06/2018 1457 DNR [id number]  Mayo, Pete Pelt, MD ED  ? ?  ? ?Advance Directive Documentation   ? ?Flowsheet Row Most Recent Value  ?Type of Advance Directive Healthcare Power of Attorney  ?Pre-existing out of facility DNR order (yellow form or pink MOST form) --  ?"MOST" Form in Place? --  ? ?  ? ?Family Communication: Spoke with son on the phone ?Disposition Plan: Status is: Inpatient ?Heart rate still in the 130s today.  He needs to take afternoon Coreg dose. ?  ?Consultants: ?Cardiology ? ?[facility] [employer]  ?[facility] ? ? ? ? ? ? ? ? ? ? ?  ?

## 2022-02-03 NOTE — Progress Notes (Signed)
[facility] Cardiology ? ?SUBJECTIVE: Patient laying flat in bed, denies chest pain ? ? ?Vitals:  ? 02/02/22 1949 02/03/22 0028 02/03/22 7564 02/03/22 0429  ?BP: 93/66 (!) 111/97 113/69   ?Pulse: 64 (!) 56 81   ?Resp: '19 20 18   '$ ?Temp: 98.6 ?F (37 ?C) 98.1 ?F (36.7 ?C) 98.6 ?F (37 ?C)   ?TempSrc:      ?SpO2: 98% 95% 100%   ?Weight:    68.2 kg  ?Height:      ? ? ? ?Intake/Output Summary (Last 24 hours) at 02/03/2022 0924 ?Last data filed at 02/03/2022 0334 ?Gross per 24 hour  ?Intake 120 ml  ?Output 3000 ml  ?Net -2880 ml  ? ? ? ? ?PHYSICAL EXAM ? ?General: Well developed, well nourished, in no acute distress ?HEENT:  Normocephalic and atramatic ?Neck:  No JVD.  ?Lungs: Clear bilaterally to auscultation and percussion. ?Heart: HRRR . Normal S1 and S2 without gallops or murmurs.  ?Abdomen: Bowel sounds are positive, abdomen soft and non-tender  ?Msk:  Back normal, normal gait. Normal strength and tone for age. ?Extremities: No clubbing, cyanosis or edema.   ?Neuro: Alert and oriented X 3. ?Psych:  Good affect, responds appropriately ? ? ?LABS: ?Basic Metabolic Panel: ?Recent Labs  ?  02/02/22 ?0501 02/03/22 ?0453  ?NA 142 141  ?K 3.8 3.3*  ?CL 105 98  ?CO2 26 32  ?GLUCOSE 94 94  ?BUN 24* 23  ?CREATININE 0.84 1.06  ?CALCIUM 8.8* 9.0  ? ?Liver Function Tests: ?Recent Labs  ?  02/01/22 ?3329  ?AST 33  ?ALT 32  ?ALKPHOS 92  ?BILITOT 1.4*  ?PROT 7.0  ?ALBUMIN 4.2  ? ?No results for input(s): LIPASE, AMYLASE in the last 72 hours. ?CBC: ?Recent Labs  ?  02/01/22 ?1157 02/02/22 ?0501  ?WBC 8.0 7.0  ?NEUTROABS 5.6  --   ?HGB 13.9 13.1  ?HCT 43.0 40.2  ?MCV 100.9* 99.5  ?PLT 140* 129*  ? ?Cardiac Enzymes: ?No results for input(s): CKTOTAL, CKMB, CKMBINDEX, TROPONINI in the last 72 hours. ?BNP: ?Invalid input(s): POCBNP ?D-Dimer: ?No results for input(s): DDIMER in the last 72 hours. ?Hemoglobin A1C: ?No results for input(s): HGBA1C in the last 72 hours. ?Fasting Lipid Panel: ?No results for input(s): CHOL, HDL, LDLCALC, TRIG, CHOLHDL,  LDLDIRECT in the last 72 hours. ?Thyroid Function Tests: ?No results for input(s): TSH, T4TOTAL, T3FREE, THYROIDAB in the last 72 hours. ? ?Invalid input(s): FREET3 ?Anemia Panel: ?No results for input(s): VITAMINB12, FOLATE, FERRITIN, TIBC, IRON, RETICCTPCT in the last 72 hours. ? ?DG Chest 2 View ? ?Result Date: 02/01/2022 ?CLINICAL DATA:  Chest pain EXAM: CHEST - 2 VIEW COMPARISON:  June 9 21. FINDINGS: Low lung volumes. Chronic prominent lung markings. Streaky right basilar opacities, favor atelectasis. No visible pneumothorax. Possible trace right pleural effusion. Enlarged cardiac silhouette. Pulmonary vascular congestion. IMPRESSION: 1. Cardiomegaly and pulmonary vascular congestion. Possible trace right pleural effusion. 2. Streaky right basilar opacities, favor atelectasis over pneumonia and/or aspiration. 3. Chronically prominent lung markings. Electronically Signed   By: Margaretha Sheffield M.D.   On: 02/01/2022 12:51   ? ? ?Echo pending ? ?TELEMETRY: Atrial fibrillation 107 bpm: ? ?ASSESSMENT AND PLAN: ? ?Principal Problem: ?  Acute on chronic systolic CHF (congestive heart failure) ([place]) ?Active Problems: ?  CHF (congestive heart failure) ([place]) ?  Dementia ([place]) ?  Essential hypertension ?  ? ?1.  PSVT converted to atrial flutter with diltiazem bolus ?2.  Atrial flutter, 90 to 110 beats bpm, rate improved on uptitrated  dose of carvedilol 12.5 mg twice daily, on Eliquis for stroke prevention ?3.  Acute on chronic congestive heart failure, HFrEF, EF 25 to 30% by echo 12/21/2020, started on furosemide 40 mg IV twice daily, check 24-hour output 3230 cc, on carvedilol, lisinopril, and spironolactone ?4.  Essential hypertension, blood pressure adequately controlled on home BP medications ?5.  Dementia ?  ?Recommendations ?  ?1.  Agree with current therapy ?2.  Continue diuresis ?3.  Carefully monitor renal status ?4.  Agree with Eliquis for stroke prevention ?5.  Continue carvedilol 12.5 mg twice daily ?6.   Review 2D echocardiogram ?7.  DNR ? ? ?Isaias Cowman, MD, PhD, [facility] ?02/03/2022 ?9:24 AM ? ? ? ?  ?

## 2022-02-04 DIAGNOSIS — N179 Acute kidney failure, unspecified: Secondary | ICD-10-CM | POA: Diagnosis not present

## 2022-02-04 DIAGNOSIS — I5023 Acute on chronic systolic (congestive) heart failure: Secondary | ICD-10-CM | POA: Diagnosis not present

## 2022-02-04 DIAGNOSIS — R410 Disorientation, unspecified: Secondary | ICD-10-CM | POA: Diagnosis not present

## 2022-02-04 DIAGNOSIS — I4892 Unspecified atrial flutter: Secondary | ICD-10-CM | POA: Diagnosis not present

## 2022-02-04 LAB — BASIC METABOLIC PANEL
Anion gap: 14 (ref 5–15)
BUN: 35 mg/dL — ABNORMAL HIGH (ref 8–23)
CO2: 29 mmol/L (ref 22–32)
Calcium: 8.8 mg/dL — ABNORMAL LOW (ref 8.9–10.3)
Chloride: 98 mmol/L (ref 98–111)
Creatinine, Ser: 1.33 mg/dL — ABNORMAL HIGH (ref 0.61–1.24)
GFR, Estimated: 50 mL/min — ABNORMAL LOW (ref >60)
Glucose, Bld: 122 mg/dL — ABNORMAL HIGH (ref 70–99)
Potassium: 3.8 mmol/L (ref 3.5–5.1)
Sodium: 141 mmol/L (ref 135–145)

## 2022-02-04 MED ORDER — AMIODARONE HCL 200 MG PO TABS: 200.0000 mg | ORAL_TABLET | Freq: Every day | ORAL | Status: DC

## 2022-02-04 MED ORDER — LISINOPRIL 5 MG PO TABS: 2.5000 mg | ORAL_TABLET | Freq: Every day | ORAL | Status: DC

## 2022-02-04 MED ORDER — AMIODARONE HCL 200 MG PO TABS
200.0000 mg | ORAL_TABLET | Freq: Two times a day (BID) | ORAL | Status: DC
  Administered 2022-02-04 – 2022-02-06 (×4): 200 mg via ORAL
  Filled 2022-02-04 (×6): qty 1

## 2022-02-04 MED ORDER — SPIRONOLACTONE 25 MG PO TABS: 12.5000 mg | ORAL_TABLET | Freq: Every day | ORAL | Status: DC

## 2022-02-04 MED ORDER — TRAZODONE HCL 50 MG PO TABS
25.0000 mg | ORAL_TABLET | Freq: Every day | ORAL | Status: DC
  Administered 2022-02-04: 25 mg via ORAL
  Filled 2022-02-04: qty 1

## 2022-02-04 NOTE — Plan of Care (Signed)
?  Problem: Activity: ?Goal: Capacity to carry out activities will improve ?Outcome: Progressing ?  ?

## 2022-02-04 NOTE — TOC Initial Note (Signed)
Transition of Care (TOC) - Initial/Assessment Note  ? ? ?Patient Details  ?Name: [name] ?MRN: [id number] ?Date of Birth: [birth year] ? ?Transition of Care (TOC) CM/SW Contact:    ?Alberteen Sam, LCSW ?Phone Number: ?02/04/2022, 10:10 AM ? ?Clinical Narrative:                 ? ?Patient disoriented x 4.  ? ?CSW spoke with patient's son Rip Harbour. He reports patient is from home with spouse, also attends a senior adult care center twice a week on Mondays and Wednesdays. Reprots patient has a rolling walker at home and tub bench, no further dme needs identified.  ? ?Rip Harbour reports they would be in agreement with [facility] PT and RN, no preference of agency. Referral sent to [facility] with [facility].  ? ?Reports at time of discharge patient son Rip Harbour will transport patient home.  ? ?TOC will continue to follow for additional needs.  ? ?Will need HH PT and RN orders at time of dc.  ? ? ?Expected Discharge Plan: [place] ?Barriers to Discharge: Continued Medical Work up ? ? ?Patient Goals and CMS Choice ?Patient states their goals for this hospitalization and ongoing recovery are:: to go home ?CMS Medicare.gov Compare Post Acute Care list provided to:: Patient Represenative (must comment) (son) ?Choice offered to / list presented to : Adult Children ? ?Expected Discharge Plan and Services ?Expected Discharge Plan: [place] ?  ?  ?  ?Living arrangements for the past 2 months: [place] ?                ?  ?  ?  ?  ?  ?HH Arranged: PT, RN ?[place] Agency: [facility] ?Date HH Agency Contacted: 02/04/22 ?[place]: 1010 ?Representative spoke with at [place]: Tommi Rumps ? ?Prior Living Arrangements/Services ?Living arrangements for the past 2 months: [place] ?Lives with:: Spouse ?Patient language and need for interpreter reviewed:: Yes ?Do you feel safe going back to the place where you live?: Yes      ?  ?  ?  ?  ? ?Activities of Daily Living ?Home Assistive  Devices/Equipment: Cane (specify quad or straight) ?ADL Screening (condition at time of admission) ?Patient's cognitive ability adequate to safely complete daily activities?: No ?Is the patient deaf or have difficulty hearing?: Yes ?Does the patient have difficulty seeing, even when wearing glasses/contacts?: No ?Does the patient have difficulty concentrating, remembering, or making decisions?: Yes ?Patient able to express need for assistance with ADLs?: Yes ?Does the patient have difficulty dressing or bathing?: Yes ?Independently performs ADLs?: No ?Communication: Independent ?Dressing (OT): Needs assistance ?Is this a change from baseline?: Pre-admission baseline ?Grooming: Independent ?Feeding: Independent ?Bathing: Needs assistance ?Is this a change from baseline?: Pre-admission baseline ?Toileting: Needs assistance ?Is this a change from baseline?: Pre-admission baseline ?In/Out Bed: Needs assistance ?Is this a change from baseline?: Pre-admission baseline ?Walks in Home: Independent with device (comment) (cane) ?Does the patient have difficulty walking or climbing stairs?: Yes ?Weakness of Legs: Both ?Weakness of Arms/Hands: Both ? ?Permission Sought/Granted ?  ?  ?   ?   ?   ?   ? ?Emotional Assessment ?  ?  ?  ?Orientation: : Fluctuating Orientation (Suspected and/or reported [place]) ?Alcohol / Substance Use: Not Applicable ?Psych Involvement: No (comment) ? ?Admission diagnosis:  CHF (congestive heart failure) ([place]) [I50.9] ?Acute congestive heart failure, unspecified heart failure type ([place]) [I50.9] ?  Patient Active Problem List  ? Diagnosis Date Noted  ? Hypokalemia   ? CHF (congestive heart failure) ([place]) 02/01/2022  ? Dementia ([place]) 02/01/2022  ? Essential hypertension 02/01/2022  ? Acute on chronic systolic CHF (congestive heart failure) ([place]) 02/01/2022  ? SVT (supraventricular tachycardia) ([place])   ? Atrial flutter with rapid ventricular response ([place])   ? Hyperlipidemia   ? DNR (do not  resuscitate)   ? Subdural hemorrhage ([place]) 02/21/2020  ? Chest pain 06/05/2018  ? ?PCP:  Dion Body, MD ?Pharmacy:   ?[facility] ?[place] [address] ?Phone: [phone] Fax: [phone] ? ? ? ? ?Social Determinants of Health (SDOH) Interventions ?  ? ?Readmission Risk Interventions ?   ? View : No data to display.  ?  ?  ?  ? ? ? ?

## 2022-02-04 NOTE — Progress Notes (Signed)
[facility] Cardiology ? ?SUBJECTIVE:  ? ?Interval History:  ?-Remains in A flutter with rate in the 130s overnight despite coreg 12.5 bid and multiple IV metoprolol boluses ?-safety sitter at bedside, patient drowsy after receiving haldol this morning ? ? ?Vitals:  ? 02/04/22 1937 02/04/22 9024 02/04/22 0708 02/04/22 0973  ?BP:  101/83 109/75 125/76  ?Pulse:  (!) 131 (!) 122 (!) 129  ?Resp: '18 20 18 19  '$ ?Temp:   98 ?F (36.7 ?C) 97.9 ?F (36.6 ?C)  ?TempSrc:   Axillary Oral  ?SpO2:   99% 99%  ?Weight:      ?Height:      ? ? ? ?Intake/Output Summary (Last 24 hours) at 02/04/2022 1017 ?Last data filed at 02/04/2022 5329 ?Gross per 24 hour  ?Intake 290 ml  ?Output 700 ml  ?Net -410 ml  ? ? ? ? ? ?PHYSICAL EXAM ? ?General: Elderly Caucasian male, Well developed, well nourished, in no acute distress, Sitting upright in PCU bed with safety sitter at bedside. ?HEENT:  Normocephalic and atraumatic ?Neck:  + JVD.  ?Lungs: normal respiratory effort on room air, Clear bilaterally to auscultation. ?Heart: tachy RRR . Normal S1 and S2 without gallops or murmurs.  ?Abdomen: nondistended appearing ?Msk:  Normal strength and tone for age. ?Extremities: No clubbing, cyanosis or edema.   ?Neuro: drowsy, not oriented ?Psych:  somnolent, but arousable ? ? ?LABS: ?Basic Metabolic Panel: ?Recent Labs  ?  02/03/22 ?0453 02/04/22 ?9242  ?NA 141 141  ?K 3.3* 3.8  ?CL 98 98  ?CO2 32 29  ?GLUCOSE 94 122*  ?BUN 23 35*  ?CREATININE 1.06 1.33*  ?CALCIUM 9.0 8.8*  ? ? ?Liver Function Tests: ?Recent Labs  ?  02/01/22 ?6834  ?AST 33  ?ALT 32  ?ALKPHOS 92  ?BILITOT 1.4*  ?PROT 7.0  ?ALBUMIN 4.2  ? ? ?No results for input(s): LIPASE, AMYLASE in the last 72 hours. ?CBC: ?Recent Labs  ?  02/01/22 ?1157 02/02/22 ?0501  ?WBC 8.0 7.0  ?NEUTROABS 5.6  --   ?HGB 13.9 13.1  ?HCT 43.0 40.2  ?MCV 100.9* 99.5  ?PLT 140* 129*  ? ? ?Cardiac Enzymes: ?No results for input(s): CKTOTAL, CKMB, CKMBINDEX, TROPONINI in the last 72 hours. ?BNP: ?Invalid input(s): POCBNP ?D-Dimer: ?No  results for input(s): DDIMER in the last 72 hours. ?Hemoglobin A1C: ?No results for input(s): HGBA1C in the last 72 hours. ?Fasting Lipid Panel: ?No results for input(s): CHOL, HDL, LDLCALC, TRIG, CHOLHDL, LDLDIRECT in the last 72 hours. ?Thyroid Function Tests: ?No results for input(s): TSH, T4TOTAL, T3FREE, THYROIDAB in the last 72 hours. ? ?Invalid input(s): FREET3 ?Anemia Panel: ?No results for input(s): VITAMINB12, FOLATE, FERRITIN, TIBC, IRON, RETICCTPCT in the last 72 hours. ? ?ECHOCARDIOGRAM COMPLETE ? ?Result Date: 02/03/2022 ?   ECHOCARDIOGRAM REPORT   Patient Name:   DRADEN COTTINGHAM Date of Exam: 02/03/2022 Medical Rec #:  [id number]           Height:       71.0 in Accession #:    [id number]          Weight:       150.4 lb Date of Birth:  Oct 08, 1927           BSA:          1.868 m? Patient Age:    88 years            BP:           113/69 mmHg Patient Gender: M  HR:           125 bpm. Exam Location:  [facility] Procedure: 2D Echo, Cardiac Doppler and Color Doppler Indications:     I50.33 Acute on chronic diastolic (congestive) heart failure  History:         Patient has no prior history of Echocardiogram examinations.                  Arrythmias:SVT (supraventricular tachycardia) and Atrial                  Flutter, Signs/Symptoms:Chest Pain; Risk Factors:Hypertension                  and Dyslipidemia.  Sonographer:     Luane School RDCS Referring Phys:  [id number] Loletha Grayer Diagnosing Phys: Isaias Cowman MD IMPRESSIONS  1. Left ventricular ejection fraction, by estimation, is 20 to 25%. The left ventricle has severely decreased function. The left ventricle demonstrates regional wall motion abnormalities (see scoring diagram/findings for description). The left ventricular internal cavity size was moderately dilated. Left ventricular diastolic parameters are indeterminate.  2. Right ventricular systolic function is normal. The right ventricular size is normal.  3. Left atrial size was  moderately dilated.  4. The mitral valve is normal in structure. Moderate mitral valve regurgitation. No evidence of mitral stenosis.  5. Tricuspid valve regurgitation is mild to moderate.  6. The aortic valve is normal in structure. Aortic valve regurgitation is mild. No aortic stenosis is present.  7. The inferior vena cava is normal in size with greater than 50% respiratory variability, suggesting right atrial pressure of 3 mmHg. FINDINGS  Left Ventricle: Left ventricular ejection fraction, by estimation, is 20 to 25%. The left ventricle has severely decreased function. The left ventricle demonstrates regional wall motion abnormalities. The left ventricular internal cavity size was moderately dilated. There is no left ventricular hypertrophy. Left ventricular diastolic parameters are indeterminate. Right Ventricle: The right ventricular size is normal. No increase in right ventricular wall thickness. Right ventricular systolic function is normal. Left Atrium: Left atrial size was moderately dilated. Right Atrium: Right atrial size was normal in size. Pericardium: There is no evidence of pericardial effusion. Mitral Valve: The mitral valve is normal in structure. Moderate mitral valve regurgitation. No evidence of mitral valve stenosis. Tricuspid Valve: The tricuspid valve is normal in structure. Tricuspid valve regurgitation is mild to moderate. No evidence of tricuspid stenosis. Aortic Valve: The aortic valve is normal in structure. Aortic valve regurgitation is mild. No aortic stenosis is present. Pulmonic Valve: The pulmonic valve was normal in structure. Pulmonic valve regurgitation is not visualized. No evidence of pulmonic stenosis. Aorta: The aortic root is normal in size and structure. Venous: The inferior vena cava is normal in size with greater than 50% respiratory variability, suggesting right atrial pressure of 3 mmHg. IAS/Shunts: No atrial level shunt detected by color flow Doppler.  LEFT VENTRICLE  PLAX 2D LVIDd:         5.74 cm     Diastology LVIDs:         5.15 cm     LV e' medial:    2.59 cm/s LV PW:         1.22 cm     LV E/e' medial:  8.7 LV IVS:        1.22 cm     LV e' lateral:   2.51 cm/s LVOT diam:     2.50 cm     LV E/e' lateral: 9.0  LV SV:         23 LV SV Index:   13 LVOT Area:     4.91 cm?  LV Volumes (MOD) LV vol d, MOD A2C: 68.6 ml LV vol d, MOD A4C: 88.0 ml LV vol s, MOD A2C: 51.6 ml LV vol s, MOD A4C: 71.8 ml LV SV MOD A2C:     17.0 ml LV SV MOD A4C:     88.0 ml LV SV MOD BP:      16.8 ml RIGHT VENTRICLE RV S prime:     4.22 cm/s TAPSE (M-mode): 0.6 cm LEFT ATRIUM              Index        RIGHT ATRIUM           Index LA diam:        5.20 cm  2.78 cm/m?   RA Area:     21.80 cm? LA Vol (A2C):   143.0 ml 76.56 ml/m?  RA Volume:   67.30 ml  36.03 ml/m? LA Vol (A4C):   138.0 ml 73.89 ml/m? LA Biplane Vol: 140.0 ml 74.96 ml/m?  AORTIC VALVE LVOT Vmax:   34.10 cm/s LVOT Vmean:  23.400 cm/s LVOT VTI:    0.048 m  AORTA Ao Root diam: 3.80 cm Ao Asc diam:  3.65 cm MITRAL VALVE                  TRICUSPID VALVE MV Area (PHT): 10.92 cm?      TR Peak grad:   13.7 mmHg MV Decel Time: 70 msec        TR Vmax:        185.00 cm/s MR Peak grad:    38.4 mmHg MR Mean grad:    23.0 mmHg    SHUNTS MR Vmax:         310.00 cm/s  Systemic VTI:  0.05 m MR Vmean:        226.0 cm/s   Systemic Diam: 2.50 cm MR PISA:         3.08 cm? MR PISA Eff ROA: 36 mm? MR PISA Radius:  0.70 cm MV E velocity: 22.60 cm/s MV A velocity: 70.40 cm/s MV E/A ratio:  0.32 Isaias Cowman MD Electronically signed by Isaias Cowman MD Signature Date/Time: 02/03/2022/3:54:58 PM    Final    ? ? ?TELEMETRY: Atrial fibrillation 107 bpm: ? ?ASSESSMENT AND PLAN: ? ?Principal Problem: ?  Acute on chronic systolic CHF (congestive heart failure) ([place]) ?Active Problems: ?  CHF (congestive heart failure) ([place]) ?  Dementia ([place]) ?  Essential hypertension ?  Atrial flutter with rapid ventricular response ([place]) ?  Hypokalemia ?  ? ?1.  PSVT converted to  atrial flutter with diltiazem bolus ?2.  Atrial flutter, 90 to 110 beats bpm, rate improved on uptitrated dose of carvedilol 12.5 mg twice daily, but back up to 130s overnight on 4/3, on Eliquis for stroke

## 2022-02-04 NOTE — Progress Notes (Signed)
Mobility Specialist - Progress Note ? ? 02/04/22 1600  ?Mobility  ?Activity Transferred from chair to bed  ?Level of Assistance Maximum assist, patient does 25-49%  ?Assistive Device Other (Comment) ?(+2)  ?Distance Ambulated (ft) 2 ft  ?Activity Response Tolerated well  ?$Mobility charge 1 Mobility  ? ? ? ?Pt transferred chair-bed with maxA +2 for all mobility with assist from sitter. Pt restrictive in extremities and requires assist to initiate tasks. Does yell out to even the slightest touch. Eyes closed throughout session. Pt left in bed with alarm set, sitter present.  ? ? ?[name] ?Mobility Specialist ?02/04/22, 5:01 PM ? ? ? ? ?

## 2022-02-04 NOTE — Progress Notes (Signed)
?   02/04/22 1122  ?Assess: MEWS Score  ?Temp 97.9 ?F (36.6 ?C)  ?BP 109/83  ?Pulse Rate (!) 124  ?Resp 15  ?SpO2 98 %  ?O2 Device Room Air  ?Assess: MEWS Score  ?MEWS Temp 0  ?MEWS Systolic 0  ?MEWS Pulse 2  ?MEWS RR 0  ?MEWS LOC 0  ?MEWS Score 2  ?MEWS Score Color Yellow  ?Treat  ?Patients response to intervention Relief  ?Document  ?Patient Outcome Stabilized after interventions  ?Assess: SIRS CRITERIA  ?SIRS Temperature  0  ?SIRS Pulse 1  ?SIRS Respirations  0  ?SIRS WBC 0  ?SIRS Score Sum  1  ? ? ?

## 2022-02-04 NOTE — Progress Notes (Signed)
?   02/04/22 1031 02/04/22 1122  ?Assess: MEWS Score  ?Temp 97.8 ?F (36.6 ?C) 97.9 ?F (36.6 ?C)  ?BP 121/75 109/83  ?ECG Heart Rate (!) 131  --   ?Resp 13 15  ?Level of Consciousness Alert  --   ?SpO2 98 %  --   ?O2 Device Room Air  --   ?Assess: MEWS Score  ?MEWS Temp 0 0  ?MEWS Systolic 0 0  ?MEWS Pulse 3 2  ?MEWS RR 1 0  ?MEWS LOC 0 0  ?MEWS Score 4 2  ?MEWS Score Color Red Yellow  ?Assess: if the MEWS score is Yellow or Red  ?Were vital signs taken at a resting state? Yes  --   ?Focused Assessment No change from prior assessment  --   ?Does the patient meet 2 or more of the SIRS criteria? No  --   ?MEWS guidelines implemented *See Row Information* No, previously yellow, continue vital signs every 4 hours  --   ?Treat  ?MEWS Interventions Administered prn meds/treatments  --   ?Pain Scale PAINAD  --   ?Pain Score 0  --   ?Breathing 0  --   ?Negative Vocalization 0  --   ?Facial Expression 0  --   ?Body Language 0  --   ?Consolability 0  --   ?PAINAD Score 0  --   ?Take Vital Signs  ?Increase Vital Sign Frequency  Red: Q 1hr X 4 then Q 4hr X 4, if remains red, continue Q 4hrs  --   ?Escalate  ?MEWS: Escalate Red: discuss with charge nurse/RN and provider, consider discussing with RRT  --   ?Notify: Charge Nurse/RN  ?Name of Charge Nurse/RN Notified britney  --   ?Date Charge Nurse/RN Notified 02/04/22  --   ?Time Charge Nurse/RN Notified 1057  --   ?Assess: SIRS CRITERIA  ?SIRS Temperature  0 0  ?SIRS Pulse 1 1  ?SIRS Respirations  0 0  ?SIRS WBC 0 0  ?SIRS Score Sum  1 1  ? ? ?

## 2022-02-04 NOTE — Progress Notes (Signed)
Physical Therapy Treatment ?Patient Details ?Name: [name] ?MRN: [id number] ?DOB: 12-06-1926 ?Today's Date: 02/04/2022 ? ? ?History of Present Illness Pt is a 88 y/o M admitted on 02/01/22 after presenting to his PCP with c/o BLE edema & found to have HR in the 140s & was referred to the ER. In the ER pt was initially in SVT, was treated & converted to a-flutter. Pt is being treated for acute on chronic CHF. PMH: dementia, CAD, HTN, HLD, MI ? ?  ?PT Comments  ? ? Pt seen for PT tx after MD cleared pt for participation in setting of ongoing elevated HR (HR 131-132 bpm during session). Pt received in bed with eyes closed & sitter present with PT providing max cuing/encouragement for pt to open eyes but with little success. Pt does answer that he is hungry so PT & sitter provided assistance for OOB mobility. Pt requires MAX assist for supine>sit due to little initiation & participation but once sitting EOB starts pushing forwards & PT has to provide resistance to ensure pt doesn't fall forward off EOB. Pt requires MAX assist +2 for STS & ultimately max assist +2 for stand pivot to recliner with pt demonstrating decreased ability to advance BLE to step. Pt left in recliner in care of NT to assist with lunch. Due to pt's functional & cognitive decline, have updated d/c recommendations to STR vs home with HHPT. Will continue to follow pt acutely to address balance, endurance, transfers, & gait with LRAD. ? ?Left message on pt's son's [name]) phone to request he bring in pt's hearing aides in hopes that will help pt. ?   ?Recommendations for follow up therapy are one component of a multi-disciplinary discharge planning process, led by the attending physician.  Recommendations may be updated based on patient status, additional functional criteria and insurance authorization. ? ?Follow Up Recommendations ? Skilled nursing-short term rehab (<3 hours/day) ?  ?  ?Assistance Recommended at Discharge Frequent or constant  Supervision/Assistance  ?Patient can return home with the following Two people to help with walking and/or transfers;Two people to help with bathing/dressing/bathroom;Direct supervision/assist for medications management;Help with stairs or ramp for entrance;Assist for transportation;Assistance with feeding;Assistance with cooking/housework;Direct supervision/assist for financial management ?  ?Equipment Recommendations ?    ?  ?Recommendations for Other Services   ? ? ?  ?Precautions / Restrictions Precautions ?Precautions: Fall ?Restrictions ?Weight Bearing Restrictions: No  ?  ? ?Mobility ? Bed Mobility ?Overal bed mobility: Needs Assistance ?  ?  ?  ?Supine to sit: Max assist, HOB elevated ?  ?  ?General bed mobility comments: Multimodal cuing to initiate, max assist for supine>sit with HOB elevated, bed rails. Assistance to upright trunk. ?  ? ?Transfers ?Overall transfer level: Needs assistance ?Equipment used: 2 person hand held assist ?Transfers: Sit to/from Stand, Bed to chair/wheelchair/BSC ?Sit to Stand: Max assist, +2 physical assistance ?Stand pivot transfers: Max assist, +2 physical assistance, +2 safety/equipment ?  ?  ?  ?  ?  ?  ? ?Ambulation/Gait ?  ?  ?  ?  ?  ?  ?  ?  ? ? ?Stairs ?  ?  ?  ?  ?  ? ? ?Wheelchair Mobility ?  ? ?Modified Rankin (Stroke Patients Only) ?  ? ? ?  ?Balance Overall balance assessment: Needs assistance ?Sitting-balance support: Feet supported, Bilateral upper extremity supported ?Sitting balance-Leahy Scale: Poor ?  ?  ?  ?Standing balance-Leahy Scale: Zero ?  ?  ?  ?  ?  ?  ?  ?  ?  ?  ?  ?  ?  ? ?  ?  Cognition Arousal/Alertness: Lethargic ?Behavior During Therapy: [facility] for tasks assessed/performed ?Overall Cognitive Status: History of cognitive impairments - at baseline ?Area of Impairment: Memory, Safety/judgement, Following commands, Problem solving, Awareness, Orientation ?  ?  ?  ?  ?  ?  ?  ?  ?Orientation Level: Disoriented to, Place, Time, Situation ?Current  Attention Level: Focused ?Memory: Decreased short-term memory, Decreased recall of precautions ?Following Commands: Follows one step commands inconsistently, Follows one step commands with increased time (with multimodal cuing) ?Safety/Judgement: Decreased awareness of deficits, Decreased awareness of safety ?Awareness: Intellectual ?Problem Solving: Slow processing, Decreased initiation, Difficulty sequencing, Requires verbal cues, Requires tactile cues ?General Comments: Pt more confused today compared to last time this PT saw him (MD reports pt developed delierium overnight). ?  ?  ? ?  ?Exercises   ? ?  ?General Comments   ?  ?  ? ?Pertinent Vitals/Pain Pain Assessment ?Pain Assessment: Faces ?Faces Pain Scale: Hurts a little bit ?Pain Location: generalized ?Pain Descriptors / Indicators: Discomfort ?Pain Intervention(s): Monitored during session  ? ? ?Home Living   ?  ?  ?  ?  ?  ?  ?  ?  ?  ?   ?  ?Prior Function    ?  ?  ?   ? ?PT Goals (current goals can now be found in the care plan section) Acute Rehab PT Goals ?Patient Stated Goal: get better ?PT Goal Formulation: With family ?Time For Goal Achievement: 02/16/22 ?Potential to Achieve Goals: Fair ?Progress towards PT goals: PT to reassess next treatment ? ?  ?Frequency ? ? ? Min 2X/week ? ? ? ?  ?PT Plan Discharge plan needs to be updated  ? ? ?Co-evaluation   ?  ?  ?  ?  ? ?  ?AM-PAC PT "6 Clicks" Mobility   ?Outcome Measure ? Help needed turning from your back to your side while in a flat bed without using bedrails?: Total ?Help needed moving from lying on your back to sitting on the side of a flat bed without using bedrails?: Total ?Help needed moving to and from a bed to a chair (including a wheelchair)?: Total ?Help needed standing up from a chair using your arms (e.g., wheelchair or bedside chair)?: Total ?Help needed to walk in hospital room?: Total ?Help needed climbing 3-5 steps with a railing? : Total ?6 Click Score: 6 ? ?  ?End of Session    ?Activity Tolerance:  (limited 2/2 confusion & lethargy) ?Patient left: in chair;with nursing/sitter in room ?  ?PT Visit Diagnosis: Unsteadiness on feet (R26.81);Muscle weakness (generalized) (M62.81);Difficulty in walking, not elsewhere classified (R26.2);Other abnormalities of gait and mobility (R26.89);History of falling (Z91.81) ?  ? ? ?Time: 6767-2094 ?PT Time Calculation (min) (ACUTE ONLY): 10 min ? ?Charges:  $Therapeutic Activity: 8-22 mins          ?          ? ?Lavone Nian, PT, DPT ?02/04/22, 1:40 PM ? ? ?Waunita Schooner ?02/04/2022, 1:36 PM ? ?

## 2022-02-04 NOTE — Progress Notes (Signed)
[facility] ?PROGRESS NOTE ? ?[name] QQP:[id number] DOB: Nov 24, 1926 DOA: 02/01/2022 ?PCP: Dion Body, MD ? ?HPI/Subjective: ?Patient seen this am.  He did not sleep very well last night.  He was reaching and picking at blanket this am.  Admitted with CHF. ? ?Objective: ?Vitals:  ? 02/04/22 1031 02/04/22 1122  ?BP: 121/75 109/83  ?Pulse:  (!) 124  ?Resp: 13 15  ?Temp: 97.8 ?F (36.6 ?C) 97.9 ?F (36.6 ?C)  ?SpO2: 98% 98%  ? ? ?Intake/Output Summary (Last 24 hours) at 02/04/2022 1236 ?Last data filed at 02/04/2022 1125 ?Gross per 24 hour  ?Intake 290 ml  ?Output 800 ml  ?Net -510 ml  ? ?Filed Weights  ? 02/02/22 0351 02/03/22 0429 02/04/22 0418  ?Weight: 73.7 kg 68.2 kg 68.4 kg  ? ? ? ?Exam: ?Physical Exam ?HENT:  ?   Head: Normocephalic.  ?   Mouth/Throat:  ?   Pharynx: No oropharyngeal exudate.  ?Eyes:  ?   General: Lids are normal.  ?   Conjunctiva/sclera: Conjunctivae normal.  ?Cardiovascular:  ?   Rate and Rhythm: Tachycardia present. Rhythm irregularly irregular.  ?   Heart sounds: Normal heart sounds, S1 normal and S2 normal.  ?Pulmonary:  ?   Breath sounds: Examination of the right-lower field reveals decreased breath sounds and rales. Examination of the left-lower field reveals decreased breath sounds and rales. Decreased breath sounds and rales present. No wheezing or rhonchi.  ?Abdominal:  ?   Palpations: Abdomen is soft.  ?   Tenderness: There is no abdominal tenderness.  ?Musculoskeletal:  ?   Right lower leg: Swelling present.  ?   Left lower leg: Swelling present.  ?Skin: ?   General: Skin is warm.  ?   Findings: No rash.  ?Neurological:  ?   Mental Status: He is alert.  ?   Comments: Able to straight leg raise.  Answers simple questions appropriately.  ? ? ? ? ?Scheduled Meds: ? amiodarone  200 mg Oral BID  ? Followed by  ? [START ON 02/11/2022] amiodarone  200 mg Oral Daily  ? apixaban  5 mg Oral BID  ? atorvastatin  20 mg Oral QPM  ? carvedilol  12.5 mg Oral BID WC  ? donepezil  10  mg Oral Daily  ? isosorbide mononitrate  30 mg Oral Daily  ? pantoprazole  40 mg Oral Daily  ? ? ? ?Assessment/Plan: ? ?Acute Delerium with underlying dementia.  Haldol prn, try to get sleeping tonight with trazadone. ?Rapid atrial flutter.  HR still 130's this am.  Continue coreg,  Cardiology added amiodarone today. ?Acute systolic chf.  Holding ace, spironolactone and duiretic with acute kidney injury. ?Acute kidney injury. Creatinine 1.33 today, was 0.84 yesterday.  Hold lasix, ace and spironolactone. ?Paroxysaml SVT ?Essential Hypertension. Continue coreg, holding other meds for now ?Hypokalemia- replaced ? ?  ? ? ?  ?Code Status:  ? ?  ?Code Status Orders  ?(From admission, onward)  ?  ? ? ?  ? ?  Start     Ordered  ? 02/01/22 1525  Do not attempt resuscitation (DNR)  Continuous       ?Question Answer Comment  ?In the event of cardiac or respiratory ARREST Do not call a ?code blue?   ?In the event of cardiac or respiratory ARREST Do not perform Intubation, CPR, defibrillation or ACLS   ?In the event of cardiac or respiratory ARREST Use medication by any route, position, wound care, and other measures  to relive pain and suffering. May use oxygen, suction and manual treatment of airway obstruction as needed for comfort.   ?Comments nurse may pronounce   ?  ? 02/01/22 1525  ? ?  ?  ? ?  ? ?Code Status History   ? ? Date Active Date Inactive Code Status Order ID Comments User Context  ? 02/21/2020 1913 02/22/2020 2035 DNR [id number]  Christel Mormon, MD ED  ? 06/05/2018 2015 06/06/2018 1457 DNR [id number]  Mayo, Pete Pelt, MD ED  ? ?  ? ?Advance Directive Documentation   ? ?Flowsheet Row Most Recent Value  ?Type of Advance Directive Healthcare Power of Attorney  ?Pre-existing out of facility DNR order (yellow form or pink MOST form) --  ?"MOST" Form in Place? --  ? ?  ? ?Family Communication: left message for son ?Disposition Plan: Status is: Inpatient ?HR still not controlled.  Also acute kiney injury today ?   ?Consultants: ?cardiology ? ?Gidget Quizh[facility] [employer]  ?[facility] ? ? ? ? ? ? ? ? ? ? ?  ?

## 2022-02-04 NOTE — Progress Notes (Signed)
?   02/04/22 1000  ?Clinical Encounter Type  ?Visited With Patient;Health care provider  ?Visit Type Initial  ?Referral From Chaplain  ?Spiritual Encounters  ?Spiritual Needs Prayer  ? ?Chaplain supported patient through meaningful conversation and reflective listening and participated in prayer together.  ?

## 2022-02-05 DIAGNOSIS — R531 Weakness: Secondary | ICD-10-CM

## 2022-02-05 DIAGNOSIS — R410 Disorientation, unspecified: Secondary | ICD-10-CM | POA: Diagnosis not present

## 2022-02-05 DIAGNOSIS — N179 Acute kidney failure, unspecified: Secondary | ICD-10-CM | POA: Diagnosis not present

## 2022-02-05 DIAGNOSIS — I5023 Acute on chronic systolic (congestive) heart failure: Secondary | ICD-10-CM | POA: Diagnosis not present

## 2022-02-05 DIAGNOSIS — I4892 Unspecified atrial flutter: Secondary | ICD-10-CM | POA: Diagnosis not present

## 2022-02-05 LAB — BASIC METABOLIC PANEL
Anion gap: 13 (ref 5–15)
BUN: 42 mg/dL — ABNORMAL HIGH (ref 8–23)
CO2: 27 mmol/L (ref 22–32)
Calcium: 8.4 mg/dL — ABNORMAL LOW (ref 8.9–10.3)
Chloride: 101 mmol/L (ref 98–111)
Creatinine, Ser: 1.03 mg/dL (ref 0.61–1.24)
GFR, Estimated: >60 mL/min (ref >60)
Glucose, Bld: 117 mg/dL — ABNORMAL HIGH (ref 70–99)
Potassium: 3.5 mmol/L (ref 3.5–5.1)
Sodium: 141 mmol/L (ref 135–145)

## 2022-02-05 MED ORDER — METOPROLOL TARTRATE 25 MG PO TABS
25.0000 mg | ORAL_TABLET | Freq: Two times a day (BID) | ORAL | Status: DC
  Administered 2022-02-06 (×2): 25 mg via ORAL
  Filled 2022-02-05 (×4): qty 1

## 2022-02-05 MED ORDER — OLANZAPINE 2.5 MG PO TABS: 2.5000 mg | ORAL_TABLET | Freq: Every day | ORAL | Status: DC

## 2022-02-05 MED ORDER — OLANZAPINE 5 MG PO TABS: 5.0000 mg | ORAL_TABLET | Freq: Every day | ORAL | Status: DC

## 2022-02-05 NOTE — Progress Notes (Signed)
PT Cancellation Note ? ?Patient Details ?Name: [name] ?MRN: [id number] ?DOB: [birth year] ? ? ?Cancelled Treatment:    Reason Eval/Treat Not Completed: Patient's level of consciousness.  Chart reviewed and attempted to see pt.  Nursing in room stating that pt has been difficult to awaken all day, and is not going to do much with therapy.  Therapist attempted to arouse pt and nurse attempted to give pt water that he requested, but pt was agitated.  Will continue to attempt to see pt as medically necessary at later date/time. ? ? ?Gwenlyn Saran, PT, DPT ?02/05/22, 3:24 PM ? ?

## 2022-02-05 NOTE — Progress Notes (Signed)
[facility] ?PROGRESS NOTE ? ?Roddy Bellamy GGE:[id number] DOB: 11-16-1926 DOA: 02/01/2022 ?PCP: Dion Body, MD ? ?HPI/Subjective: ?I stop by the room 3 times today.  Patient was sleeping when I saw him the first 2 times.  The last time he did get a little agitated when I examined him.  Unable to provide history today. ? ?Objective: ?Vitals:  ? 02/05/22 1109 02/05/22 1200  ?BP: 97/69   ?Pulse: (!) 110   ?Resp: 19 19  ?Temp: 98.7 ?F (37.1 ?C)   ?SpO2: 95%   ? ? ?Intake/Output Summary (Last 24 hours) at 02/05/2022 1301 ?Last data filed at 02/05/2022 1109 ?Gross per 24 hour  ?Intake 280 ml  ?Output 1120 ml  ?Net -840 ml  ? ?Filed Weights  ? 02/03/22 0429 02/04/22 0418 02/05/22 0416  ?Weight: 68.2 kg 68.4 kg 67.2 kg  ? ? ?ROS: ?Review of Systems  ?Unable to perform ROS: Acuity of condition  ?Exam: ?Physical Exam ?HENT:  ?   Head: Normocephalic.  ?   Mouth/Throat:  ?   Pharynx: No oropharyngeal exudate.  ?Eyes:  ?   General: Lids are normal.  ?   Conjunctiva/sclera: Conjunctivae normal.  ?Cardiovascular:  ?   Rate and Rhythm: Normal rate and regular rhythm.  ?   Heart sounds: Normal heart sounds, S1 normal and S2 normal.  ?Pulmonary:  ?   Breath sounds: Examination of the right-lower field reveals decreased breath sounds and rales. Examination of the left-lower field reveals decreased breath sounds and rales. Decreased breath sounds and rales present. No wheezing or rhonchi.  ?Abdominal:  ?   Palpations: Abdomen is soft.  ?   Tenderness: There is no abdominal tenderness.  ?Musculoskeletal:  ?   Right lower leg: Swelling present.  ?   Left lower leg: Swelling present.  ?Skin: ?   General: Skin is warm.  ?   Findings: No rash.  ?Neurological:  ?   Mental Status: He is lethargic.  ? ? ? ? ?Scheduled Meds: ? amiodarone  200 mg Oral BID  ? Followed by  ? [START ON 02/11/2022] amiodarone  200 mg Oral Daily  ? apixaban  5 mg Oral BID  ? atorvastatin  20 mg Oral QPM  ? donepezil  10 mg Oral Daily  ? isosorbide  mononitrate  30 mg Oral Daily  ? metoprolol tartrate  25 mg Oral BID  ? pantoprazole  40 mg Oral Daily  ? traZODone  25 mg Oral QHS  ? ?Brief history.  88 year old man with history of dementia, hyperlipidemia, hypertension and cardiomyopathy and CAD.  Patient sent in by PMD with lower extremity edema and fast heart rate.  Initially found to have SVT but then converted over to atrial flutter rapid heart rate.  Patient was diuresed for congestive heart failure.  EF low at 20 to 25%.  Blood pressure on the lower side with having to go up on Coreg for heart rate.  We needed to add amiodarone to control heart rate better.  Held IV Lasix, spironolactone and ACE inhibitor secondary to acute kidney injury and relative hypotension.  The patient has had acute delirium for the last 2 days.  The patient las lethargic all morning today. ? ?Assessment/Plan: ? ?Acute delirium with underlying dementia.  Haldol as needed.  We we will try Zyprexa at night instead of trazodone. ?Rapid atrial flutter.  Heart rates better today in the 110s with Coreg and amiodarone.  On Eliquis for anticoagulation. ?Acute on chronic systolic congestive heart  failure.  Holding ACE, spironolactone and diuretic with acute kidney injury and relative hypotension.  Continue Coreg. ?Acute kidney injury with creatinine going up to 1.33 yesterday and was 0.84 on 02/02/2022.  Creatinine down to 1.03 today. ?Paroxysmal SVT that converted to atrial flutter ?Essential hypertension on Coreg.  Holding other blood pressure meds right now with relative hypotension ?Hypokalemia replaced ?Weakness.  PT now recommending rehab but he was delirious with this evaluation.  Need to evaluate once his mental status is better. ? ?  ? ? ?  ?Code Status:  ? ?  ?Code Status Orders  ?(From admission, onward)  ?  ? ? ?  ? ?  Start     Ordered  ? 02/01/22 1525  Do not attempt resuscitation (DNR)  Continuous       ?Question Answer Comment  ?In the event of cardiac or respiratory ARREST Do  not call a ?code blue?   ?In the event of cardiac or respiratory ARREST Do not perform Intubation, CPR, defibrillation or ACLS   ?In the event of cardiac or respiratory ARREST Use medication by any route, position, wound care, and other measures to relive pain and suffering. May use oxygen, suction and manual treatment of airway obstruction as needed for comfort.   ?Comments nurse may pronounce   ?  ? 02/01/22 1525  ? ?  ?  ? ?  ? ?Code Status History   ? ? Date Active Date Inactive Code Status Order ID Comments User Context  ? 02/21/2020 1913 02/22/2020 2035 DNR [id number]  Christel Mormon, MD ED  ? 06/05/2018 2015 06/06/2018 1457 DNR [id number]  Mayo, Pete Pelt, MD ED  ? ?  ? ?Advance Directive Documentation   ? ?Flowsheet Row Most Recent Value  ?Type of Advance Directive Healthcare Power of Attorney  ?Pre-existing out of facility DNR order (yellow form or pink MOST form) --  ?"MOST" Form in Place? --  ? ?  ? ?Family Communication: Updated patient's son on the phone ?Disposition Plan: Status is: Inpatient ?Remains inpatient appropriate because: With acute delirium makes disposition difficult right at this point. ?  ?Consultants: ?Cardiology ? ?Laqu[facility] Wel[facility] [employer]  ?[facility] ? ? ? ? ? ? ? ? ? ? ?  ?

## 2022-02-05 NOTE — Plan of Care (Signed)
  Problem: Education: Goal: Ability to demonstrate management of disease process will improve Outcome: Progressing Goal: Ability to verbalize understanding of medication therapies will improve Outcome: Progressing Goal: Individualized Educational Video(s) Outcome: Progressing   Problem: Activity: Goal: Capacity to carry out activities will improve Outcome: Progressing   Problem: Cardiac: Goal: Ability to achieve and maintain adequate cardiopulmonary perfusion will improve Outcome: Progressing   Problem: Education: Goal: Knowledge of disease or condition will improve Outcome: Progressing Goal: Understanding of medication regimen will improve Outcome: Progressing Goal: Individualized Educational Video(s) Outcome: Progressing   Problem: Activity: Goal: Ability to tolerate increased activity will improve Outcome: Progressing   Problem: Cardiac: Goal: Ability to achieve and maintain adequate cardiopulmonary perfusion will improve Outcome: Progressing   Problem: Health Behavior/Discharge Planning: Goal: Ability to safely manage health-related needs after discharge will improve Outcome: Progressing   

## 2022-02-05 NOTE — Progress Notes (Signed)
[facility] Cardiology ? ?SUBJECTIVE:  ? ?Interval History:  ?-Remains in A flutter with rate mostly in the 100s, periods up to 120s ?-laying flat, sleeping during interview.  ? ? ?Vitals:  ? 02/04/22 1950 02/04/22 2326 02/05/22 0416 02/05/22 0754  ?BP: (!) '97/58 95/69 99/69 '$ 93/61  ?Pulse: (!) 115 100 (!) 112 99  ?Resp: '19 17 14 19  '$ ?Temp: 98.4 ?F (36.9 ?C) 97.9 ?F (36.6 ?C) 98.2 ?F (36.8 ?C) 98.8 ?F (37.1 ?C)  ?TempSrc:      ?SpO2: 94% (!) 89% 92% 95%  ?Weight:   67.2 kg   ?Height:      ? ? ? ?Intake/Output Summary (Last 24 hours) at 02/05/2022 1000 ?Last data filed at 02/05/2022 0755 ?Gross per 24 hour  ?Intake 280 ml  ?Output 1220 ml  ?Net -940 ml  ? ? ? ? ? ?PHYSICAL EXAM ? ?General: Elderly Caucasian male, Well developed, well nourished, in no acute distress, laying flat in PCU bed, eyes closed.  ?HEENT:  Normocephalic and atraumatic ?Neck: no JVD.  ?Lungs: normal respiratory effort on room air, Clear bilaterally to auscultation. ?Heart: tachy RRR . Normal S1 and S2 without gallops or murmurs.  ?Abdomen: nondistended appearing ?Msk:  Normal strength and tone for age. Spontaneously moved his upper extremities ?Extremities: No clubbing, cyanosis or edema.   ?Neuro: sleeping, unable to assess. ?Psych:  somnolent, but arousable ? ? ?LABS: ?Basic Metabolic Panel: ?Recent Labs  ?  02/04/22 ?6389 02/05/22 ?0414  ?NA 141 141  ?K 3.8 3.5  ?CL 98 101  ?CO2 29 27  ?GLUCOSE 122* 117*  ?BUN 35* 42*  ?CREATININE 1.33* 1.03  ?CALCIUM 8.8* 8.4*  ? ? ?Liver Function Tests: ?No results for input(s): AST, ALT, ALKPHOS, BILITOT, PROT, ALBUMIN in the last 72 hours. ? ?No results for input(s): LIPASE, AMYLASE in the last 72 hours. ?CBC: ?No results for input(s): WBC, NEUTROABS, HGB, HCT, MCV, PLT in the last 72 hours. ? ?Cardiac Enzymes: ?No results for input(s): CKTOTAL, CKMB, CKMBINDEX, TROPONINI in the last 72 hours. ?BNP: ?Invalid input(s): POCBNP ?D-Dimer: ?No results for input(s): DDIMER in the last 72 hours. ?Hemoglobin A1C: ?No results  for input(s): HGBA1C in the last 72 hours. ?Fasting Lipid Panel: ?No results for input(s): CHOL, HDL, LDLCALC, TRIG, CHOLHDL, LDLDIRECT in the last 72 hours. ?Thyroid Function Tests: ?No results for input(s): TSH, T4TOTAL, T3FREE, THYROIDAB in the last 72 hours. ? ?Invalid input(s): FREET3 ?Anemia Panel: ?No results for input(s): VITAMINB12, FOLATE, FERRITIN, TIBC, IRON, RETICCTPCT in the last 72 hours. ? ?ECHOCARDIOGRAM COMPLETE ? ?Result Date: 02/03/2022 ?   ECHOCARDIOGRAM REPORT   Patient Name:   [name] Date of Exam: 02/03/2022 Medical Rec #:  [id number]           Height:       71.0 in Accession #:    [id number]          Weight:       150.4 lb Date of Birth:  [birth year]           BSA:          1.868 m? Patient Age:    88 years            BP:           113/69 mmHg Patient Gender: M                   HR:           125 bpm. Exam Location:  [facility] Procedure: 2D Echo, Cardiac Doppler and Color Doppler Indications:     I50.33 Acute on chronic diastolic (congestive) heart failure  History:         Patient has no prior history of Echocardiogram examinations.                  Arrythmias:SVT (supraventricular tachycardia) and Atrial                  Flutter, Signs/Symptoms:Chest Pain; Risk Factors:Hypertension                  and Dyslipidemia.  Sonographer:     Luane School RDCS Referring Phys:  [id number] Loletha Grayer Diagnosing Phys: Isaias Cowman MD IMPRESSIONS  1. Left ventricular ejection fraction, by estimation, is 20 to 25%. The left ventricle has severely decreased function. The left ventricle demonstrates regional wall motion abnormalities (see scoring diagram/findings for description). The left ventricular internal cavity size was moderately dilated. Left ventricular diastolic parameters are indeterminate.  2. Right ventricular systolic function is normal. The right ventricular size is normal.  3. Left atrial size was moderately dilated.  4. The mitral valve is normal in structure. Moderate mitral valve  regurgitation. No evidence of mitral stenosis.  5. Tricuspid valve regurgitation is mild to moderate.  6. The aortic valve is normal in structure. Aortic valve regurgitation is mild. No aortic stenosis is present.  7. The inferior vena cava is normal in size with greater than 50% respiratory variability, suggesting right atrial pressure of 3 mmHg. FINDINGS  Left Ventricle: Left ventricular ejection fraction, by estimation, is 20 to 25%. The left ventricle has severely decreased function. The left ventricle demonstrates regional wall motion abnormalities. The left ventricular internal cavity size was moderately dilated. There is no left ventricular hypertrophy. Left ventricular diastolic parameters are indeterminate. Right Ventricle: The right ventricular size is normal. No increase in right ventricular wall thickness. Right ventricular systolic function is normal. Left Atrium: Left atrial size was moderately dilated. Right Atrium: Right atrial size was normal in size. Pericardium: There is no evidence of pericardial effusion. Mitral Valve: The mitral valve is normal in structure. Moderate mitral valve regurgitation. No evidence of mitral valve stenosis. Tricuspid Valve: The tricuspid valve is normal in structure. Tricuspid valve regurgitation is mild to moderate. No evidence of tricuspid stenosis. Aortic Valve: The aortic valve is normal in structure. Aortic valve regurgitation is mild. No aortic stenosis is present. Pulmonic Valve: The pulmonic valve was normal in structure. Pulmonic valve regurgitation is not visualized. No evidence of pulmonic stenosis. Aorta: The aortic root is normal in size and structure. Venous: The inferior vena cava is normal in size with greater than 50% respiratory variability, suggesting right atrial pressure of 3 mmHg. IAS/Shunts: No atrial level shunt detected by color flow Doppler.  LEFT VENTRICLE PLAX 2D LVIDd:         5.74 cm     Diastology LVIDs:         5.15 cm     LV e' medial:     2.59 cm/s LV PW:         1.22 cm     LV E/e' medial:  8.7 LV IVS:        1.22 cm     LV e' lateral:   2.51 cm/s LVOT diam:     2.50 cm     LV E/e' lateral: 9.0 LV SV:         23 LV SV Index:  13 LVOT Area:     4.91 cm?  LV Volumes (MOD) LV vol d, MOD A2C: 68.6 ml LV vol d, MOD A4C: 88.0 ml LV vol s, MOD A2C: 51.6 ml LV vol s, MOD A4C: 71.8 ml LV SV MOD A2C:     17.0 ml LV SV MOD A4C:     88.0 ml LV SV MOD BP:      16.8 ml RIGHT VENTRICLE RV S prime:     4.22 cm/s TAPSE (M-mode): 0.6 cm LEFT ATRIUM              Index        RIGHT ATRIUM           Index LA diam:        5.20 cm  2.78 cm/m?   RA Area:     21.80 cm? LA Vol (A2C):   143.0 ml 76.56 ml/m?  RA Volume:   67.30 ml  36.03 ml/m? LA Vol (A4C):   138.0 ml 73.89 ml/m? LA Biplane Vol: 140.0 ml 74.96 ml/m?  AORTIC VALVE LVOT Vmax:   34.10 cm/s LVOT Vmean:  23.400 cm/s LVOT VTI:    0.048 m  AORTA Ao Root diam: 3.80 cm Ao Asc diam:  3.65 cm MITRAL VALVE                  TRICUSPID VALVE MV Area (PHT): 10.92 cm?      TR Peak grad:   13.7 mmHg MV Decel Time: 70 msec        TR Vmax:        185.00 cm/s MR Peak grad:    38.4 mmHg MR Mean grad:    23.0 mmHg    SHUNTS MR Vmax:         310.00 cm/s  Systemic VTI:  0.05 m MR Vmean:        226.0 cm/s   Systemic Diam: 2.50 cm MR PISA:         3.08 cm? MR PISA Eff ROA: 36 mm? MR PISA Radius:  0.70 cm MV E velocity: 22.60 cm/s MV A velocity: 70.40 cm/s MV E/A ratio:  0.32 Isaias Cowman MD Electronically signed by Isaias Cowman MD Signature Date/Time: 02/03/2022/3:54:58 PM    Final    ? ? ?TELEMETRY: Atrial fibrillation 107 bpm: ? ?ASSESSMENT AND PLAN: ? ?Principal Problem: ?  Acute on chronic systolic CHF (congestive heart failure) ([place]) ?Active Problems: ?  CHF (congestive heart failure) ([place]) ?  Dementia ([place]) ?  Essential hypertension ?  Atrial flutter with rapid ventricular response ([place]) ?  Hypokalemia ?  Delirium ?  AKI (acute kidney injury) ([place]) ?  ? ?1.  PSVT converted to atrial flutter with diltiazem bolus ?2.   Atrial flutter, 90 to 110 beats bpm, rate improved on uptitrated dose of carvedilol 12.5 mg twice daily, but back up to 130s overnight on 4/3, switching BB to metoprolol tartrate '25mg'$  BID, on Eliquis

## 2022-02-05 NOTE — Progress Notes (Signed)
?  Since patient still sleeping this afternoon.  I will hold off on any sedative medications tonight.  Discontinued trazodone.  Will not give Zyprexa. ? ?Dr. Loletha Grayer ?

## 2022-02-06 ENCOUNTER — Other Ambulatory Visit (HOSPITAL_COMMUNITY): Payer: Self-pay

## 2022-02-06 DIAGNOSIS — I5023 Acute on chronic systolic (congestive) heart failure: Secondary | ICD-10-CM | POA: Diagnosis not present

## 2022-02-06 MED ORDER — METOPROLOL TARTRATE 5 MG/5ML IV SOLN
2.5000 mg | INTRAVENOUS | Status: DC | PRN
  Administered 2022-02-07 – 2022-02-11 (×10): 2.5 mg via INTRAVENOUS
  Filled 2022-02-06 (×11): qty 5

## 2022-02-06 NOTE — Progress Notes (Addendum)
Manufacturing engineer [name]) Hospital Liaison Note ? ?Received request from [name] for family interest in Home w/ Hospice. MSW spoke with son [name] [phone] to initiate education related to hospice philosophy, services, and team approach to care. Ervin verbalized understanding of information given. Per discussion, family requested the [facility] hold off until tomorrow to begin evaluation process as they want to ensure they are making the correct steps.  ? ?TOC & MD aware. MSW to follow up with family tomorrow following family's discussion with MD. ? ?Please do not hesitate to call with any hospice related questions.  ?  ?Thank you for the opportunity to participate in this patient's care. ? ?[name], MSW ?[place]  ?[phone] ? ?

## 2022-02-06 NOTE — TOC Benefit Eligibility Note (Signed)
Patient Advocate Encounter  Insurance verification completed.    The patient is currently admitted and upon discharge could be taking Eliquis 5 mg.  The current 30 day co-pay is, $45.00.   The patient is insured through [employer] Medicare Part D     Iva Montelongo, CPhT Pharmacy Patient Advocate Specialist [facility] Patient Advocate Team Direct Number: [phone]  Fax: [phone]        

## 2022-02-06 NOTE — Care Management (Signed)
Spoke at length to patient's son [name] [phone].  Mr. [name] relates that he would like more information on hospice services at home.  He is local to [place] and would like to engage [facility] hospice. ? ?I agree that this is an appropriate disposition plan.  Patient is 88 years old with advanced heart failure with markedly reduced ejection fraction.  Prognosis is poor. ? ?We will continue current scope of treatment for now.  DNR/DNI status ?TOC consult requested to engage hospice liaison ? ?Ralene Muskrat MD ?

## 2022-02-06 NOTE — Progress Notes (Signed)
PT Cancellation Note ? ?Patient Details ?Name: [name] ?MRN: [id number] ?DOB: [birth year] ? ? ?Cancelled Treatment:    Reason Eval/Treat Not Completed: Other (comment). Pt does wake to sternal rub, but very resistant to any attempts to move UE or LE. Does grip therapist hand very tightly, and needed to be physically removed, unable to follow any commands at this time. PT to re-attempt as able.  ? ?Lieutenant Diego PT, DPT ?2:22 PM,02/06/22 ? ?

## 2022-02-06 NOTE — Progress Notes (Signed)
?PROGRESS NOTE ? ? ? ?[name]  VOH:[id number] DOB: [birth year] DOA: 02/01/2022 ?PCP: Dion Body, MD  ? ? ?Brief Narrative:  ? 88 year old man with history of dementia, hyperlipidemia, hypertension and cardiomyopathy and CAD.  Patient sent in by PMD with lower extremity edema and fast heart rate.  Initially found to have SVT but then converted over to atrial flutter rapid heart rate.  Patient was diuresed for congestive heart failure.  EF low at 20 to 25%.  Blood pressure on the lower side with having to go up on Coreg for heart rate.  We needed to add amiodarone to control heart rate better.  Held IV Lasix, spironolactone and ACE inhibitor secondary to acute kidney injury and relative hypotension.  The patient has had acute delirium for the last 2 days.    ? ? ?Assessment & Plan: ?  ?Principal Problem: ?  Acute on chronic systolic CHF (congestive heart failure) ([place]) ?Active Problems: ?  CHF (congestive heart failure) ([place]) ?  Dementia ([place]) ?  Essential hypertension ?  Atrial flutter with rapid ventricular response ([place]) ?  Hypokalemia ?  Delirium ?  AKI (acute kidney injury) ([place]) ?  Weakness ? ?Acute delirium with underlying dementia ?Patient's been more lethargic since 4/3 ?Last dose of Haldol received 4/3 ?Nightly Zyprexa stopped ?Patient remains somewhat lethargic but is easily aroused and tends to be somewhat agitated on arousal ?Plan: ?Monitor for at least the next 24 hours off all sedating medications ?Avoid anticholinergics, benzodiazepines, narcotics, antipsychotics ? ?Atrial flutter with rapid ventricular response ?On Coreg and amiodarone ?Rates improved ?Continue rate control and Eliquis for anticoagulation ? ?Acute on chronic systolic congestive heart failure ?Currently hypotension precluding use of ACE inhibitor, Aldactone, diuretic ?Continue Coreg ? ?Acute kidney injury ?Improving ?Patient 7 L net negative ?Check BMP in a.m. ?Reduce diuretic as needed ? ?Supraventricular  tachycardia ?Now converted atrial flutter ?Continue telemetry ? ?Essential hypertension ?Continuing Coreg ?Other blood pressure medications on hold with relative hypotension ? ?Hypokalemia ?Monitor and replace ? ?Weakness ?Functional decline ?Therapy recommending rehab ?Delirium precluding safe discharge at this time ?Monitor and reevaluate once mental status is improved ? ? ?DVT prophylaxis: Eliquis ?Code Status: DNR ?Family Communication: Son via phone [phone] on 4/5 ?Disposition Plan: Status is: Inpatient ?Remains inpatient appropriate because: Acute metabolic encephalopathy.  Acute decompensated heart failure.  Rapid atrial flutter ? ? ?Level of care: Progressive ? ?Consultants:  ?Cardiology ? ?Procedures:  ?None ? ?Antimicrobials: ?None ? ? ?Subjective: ?Seen and examined.  Lethargic and sleepy.  Awakens but does not participate in interview. ? ?Objective: ?Vitals:  ? 02/06/22 0308 02/06/22 0404 02/06/22 0842 02/06/22 1145  ?BP: (!) 88/73  105/74 93/70  ?Pulse: 92  (!) 107 (!) 110  ?Resp: '20  17 18  '$ ?Temp: 98.7 ?F (37.1 ?C)  98.1 ?F (36.7 ?C) 98.3 ?F (36.8 ?C)  ?TempSrc: Axillary     ?SpO2: 96%  94% 90%  ?Weight:  66.5 kg    ?Height:      ? ? ?Intake/Output Summary (Last 24 hours) at 02/06/2022 1421 ?Last data filed at 02/06/2022 [phone] ?Gross per 24 hour  ?Intake --  ?Output 300 ml  ?Net -300 ml  ? ?Filed Weights  ? 02/04/22 0418 02/05/22 0416 02/06/22 0404  ?Weight: 68.4 kg 67.2 kg 66.5 kg  ? ? ?Examination: ? ?General exam: No acute distress.  Appears frail and chronically ill ?Respiratory system: Poor respiratory effort.  Bibasilar crackles.  2 L.  Normal work of breathing ?Cardiovascular system: Tachycardic,  irregular rhythm, no murmurs ?Gastrointestinal system: Soft, NT/ND, normal bowel sounds ?Central nervous system: Lethargic.  Unable to assess orientation ?Extremities: Unable to assess power ?Skin: No rashes, lesions or ulcers ?Psychiatry: Unable to assess ? ? ? ?Data Reviewed: I have personally  reviewed following labs and imaging studies ? ?CBC: ?Recent Labs  ?Lab 02/01/22 ?1157 02/02/22 ?0501  ?WBC 8.0 7.0  ?NEUTROABS 5.6  --   ?HGB 13.9 13.1  ?HCT 43.0 40.2  ?MCV 100.9* 99.5  ?PLT 140* 129*  ? ?Basic Metabolic Panel: ?Recent Labs  ?Lab 02/01/22 ?1157 02/02/22 ?0501 02/03/22 ?0453 02/04/22 ?2440 02/05/22 ?0414  ?NA 143 142 141 141 141  ?K 4.0 3.8 3.3* 3.8 3.5  ?CL 108 105 98 98 101  ?CO2 26 26 32 29 27  ?GLUCOSE 104* 94 94 122* 117*  ?BUN 21 24* 23 35* 42*  ?CREATININE 0.96 0.84 1.06 1.33* 1.03  ?CALCIUM 8.7* 8.8* 9.0 8.8* 8.4*  ? ?GFR: ?Estimated Creatinine Clearance: 42.1 mL/min (by C-G formula based on SCr of 1.03 mg/dL). ?Liver Function Tests: ?Recent Labs  ?Lab 02/01/22 ?1157  ?AST 33  ?ALT 32  ?ALKPHOS 92  ?BILITOT 1.4*  ?PROT 7.0  ?ALBUMIN 4.2  ? ?No results for input(s): LIPASE, AMYLASE in the last 168 hours. ?No results for input(s): AMMONIA in the last 168 hours. ?Coagulation Profile: ?No results for input(s): INR, PROTIME in the last 168 hours. ?Cardiac Enzymes: ?No results for input(s): CKTOTAL, CKMB, CKMBINDEX, TROPONINI in the last 168 hours. ?BNP (last 3 results) ?No results for input(s): PROBNP in the last 8760 hours. ?HbA1C: ?No results for input(s): HGBA1C in the last 72 hours. ?CBG: ?No results for input(s): GLUCAP in the last 168 hours. ?Lipid Profile: ?No results for input(s): CHOL, HDL, LDLCALC, TRIG, CHOLHDL, LDLDIRECT in the last 72 hours. ?Thyroid Function Tests: ?No results for input(s): TSH, T4TOTAL, FREET4, T3FREE, THYROIDAB in the last 72 hours. ?Anemia Panel: ?No results for input(s): VITAMINB12, FOLATE, FERRITIN, TIBC, IRON, RETICCTPCT in the last 72 hours. ?Sepsis Labs: ?Recent Labs  ?Lab 02/01/22 ?1417 02/02/22 ?0501 02/03/22 ?0453  ?PROCALCITON <0.10 <0.10 <0.10  ? ? ?No results found for this or any previous visit (from the past 240 hour(s)).  ? ? ? ? ? ?Radiology Studies: ?No results found. ? ? ? ? ? ?Scheduled Meds: ? amiodarone  200 mg Oral BID  ? Followed by  ?  [START ON 02/11/2022] amiodarone  200 mg Oral Daily  ? apixaban  5 mg Oral BID  ? atorvastatin  20 mg Oral QPM  ? isosorbide mononitrate  30 mg Oral Daily  ? metoprolol tartrate  25 mg Oral BID  ? pantoprazole  40 mg Oral Daily  ? ?Continuous Infusions: ? ? LOS: 5 days  ? ? ? ? ?Sidney Ace, MD ?[facility] ? ? ?If 7PM-7AM, please contact night-coverage ? ?02/06/2022, 2:21 PM  ? ?

## 2022-02-06 NOTE — Consult Note (Signed)
? ?  Heart Failure Nurse Navigator Note ? ?HFrEF 20 to 25%.  Left ventricular wall motion abnormalities noted.  Left ventricular internal cavity is moderately dilated.  Mild to moderate tricuspid regurgitation moderate mitral valve regurgitation. ? ?Patient had presented to his primary care physician's office for complaints of increased lower extremity edema.  Was found to have a heart rate of 140 bpm and was referred to the emergency room. ? ?Comorbidities: ? ?Dementia ?Coronary artery disease with stent placement ?Hypertension ?Hyperlipidemia ? ?Medications: ? ?Amiodarone 200 mg 2 times a day ?Eliquis 5 mg 2 times a day ?Atorvastatin 20 mg daily ?Isosorbide mononitrate 30 mg daily ?Metoprolol tartrate 25 mg 2 times a day ? ?Labs: ? ? ?Sodium 141, potassium 3.5, chloride 101, CO2 27, BUN 42, creatinine 1.03 ?Weight is 116.3 kg ?Intake 698 mL ?Output 400 mL ? ? ?Initial meeting with patient's sons are Rip Harbour and Nori Riis who are at the bedside.  Patient was sleeping. ? ?He lives at home with his 87 year old wife, son lives behind them and checks on them throughout the day. ? ?They tell me that their father is 1 that likes his salt and does not want it removed from the table.  Explained the relationship between salt/sodium and fluids. ? ?Discussed other ways to seasoning food other than with salt.  He states on Friday's that he likes to get sausage gravy and biscuit.  Also discussed making wise choices when getting food from the restaurants.  Or making other allotments throughout the day allowing for that higher sodium meal.  Totaling 2000 mg within a days time. ? ?Also discussed daily fluid intake.  They state that he will drink 1 bottled water, glasses of sweet tea and also Gatorade.  Made aware that Gatorade is higher in sodium. ? ?They state that when he is at home he does fairly well, he goes to an adult activities center twice a week.  He also go shopping with 1 son and the patient is the one that picks out the  groceries that they need. ? ?They also voiced concerns over when the patient goes home getting help from [facility].  Made attending and TOC aware. ? ?Also made them aware of the ventricle health program that would monitor his father daily with daily weights, blood pressures and his heart rhythm.  Also includes video chats every 2 weeks with a cardiologist.  They would like some information on that and it was left at the bedside. ? ?He has an appointment with the outpatient heart failure clinic on April 11 at 1:30 in the afternoon.  He has a 0% no-show which is 0 out of 21 appointments. ? ?Pricilla Riffle RN [facility] ?

## 2022-02-07 ENCOUNTER — Inpatient Hospital Stay: Payer: Medicare HMO

## 2022-02-07 DIAGNOSIS — I5023 Acute on chronic systolic (congestive) heart failure: Secondary | ICD-10-CM | POA: Diagnosis not present

## 2022-02-07 DIAGNOSIS — E440 Moderate protein-calorie malnutrition: Secondary | ICD-10-CM | POA: Insufficient documentation

## 2022-02-07 LAB — CBC WITH DIFFERENTIAL/PLATELET
Abs Immature Granulocytes: 0.07 10*3/uL (ref 0.00–0.07)
Basophils Absolute: 0 10*3/uL (ref 0.0–0.1)
Basophils Relative: 0 %
Eosinophils Absolute: 0 10*3/uL (ref 0.0–0.5)
Eosinophils Relative: 0 %
HCT: 42.6 % (ref 39.0–52.0)
Hemoglobin: 14 g/dL (ref 13.0–17.0)
Immature Granulocytes: 1 %
Lymphocytes Relative: 7 %
Lymphs Abs: 1 10*3/uL (ref 0.7–4.0)
MCH: 32 pg (ref 26.0–34.0)
MCHC: 32.9 g/dL (ref 30.0–36.0)
MCV: 97.3 fL (ref 80.0–100.0)
Monocytes Absolute: 1.6 10*3/uL — ABNORMAL HIGH (ref 0.1–1.0)
Monocytes Relative: 11 %
Neutro Abs: 11.7 10*3/uL — ABNORMAL HIGH (ref 1.7–7.7)
Neutrophils Relative %: 81 %
Platelets: 138 10*3/uL — ABNORMAL LOW (ref 150–400)
RBC: 4.38 MIL/uL (ref 4.22–5.81)
RDW: 14.6 % (ref 11.5–15.5)
WBC: 14.4 10*3/uL — ABNORMAL HIGH (ref 4.0–10.5)
nRBC: 0 % (ref 0.0–0.2)

## 2022-02-07 LAB — URINALYSIS, COMPLETE (UACMP) WITH MICROSCOPIC
Bilirubin Urine: NEGATIVE
Glucose, UA: NEGATIVE mg/dL
Hgb urine dipstick: NEGATIVE
Ketones, ur: 5 mg/dL — AB
Leukocytes,Ua: NEGATIVE
Nitrite: NEGATIVE
Protein, ur: 30 mg/dL — AB
Specific Gravity, Urine: 1.028 (ref 1.005–1.030)
pH: 5 (ref 5.0–8.0)

## 2022-02-07 LAB — BASIC METABOLIC PANEL
Anion gap: 12 (ref 5–15)
BUN: 62 mg/dL — ABNORMAL HIGH (ref 8–23)
CO2: 28 mmol/L (ref 22–32)
Calcium: 8.7 mg/dL — ABNORMAL LOW (ref 8.9–10.3)
Chloride: 103 mmol/L (ref 98–111)
Creatinine, Ser: 1.1 mg/dL (ref 0.61–1.24)
GFR, Estimated: >60 mL/min (ref >60)
Glucose, Bld: 117 mg/dL — ABNORMAL HIGH (ref 70–99)
Potassium: 3.1 mmol/L — ABNORMAL LOW (ref 3.5–5.1)
Sodium: 143 mmol/L (ref 135–145)

## 2022-02-07 LAB — MAGNESIUM: Magnesium: 2 mg/dL (ref 1.7–2.4)

## 2022-02-07 MED ORDER — SODIUM CHLORIDE 0.9 % IV SOLN: INTRAVENOUS | Status: DC | PRN

## 2022-02-07 MED ORDER — POTASSIUM CHLORIDE 10 MEQ/100ML IV SOLN
10.0000 meq | INTRAVENOUS | Status: AC
  Administered 2022-02-07 (×3): 10 meq via INTRAVENOUS
  Filled 2022-02-07 (×3): qty 100

## 2022-02-07 MED ORDER — SPIRONOLACTONE 25 MG PO TABS
12.5000 mg | ORAL_TABLET | Freq: Every day | ORAL | Status: DC
  Filled 2022-02-07 (×5): qty 0.5

## 2022-02-07 MED ORDER — SODIUM CHLORIDE 0.9 % IV SOLN
1.0000 g | INTRAVENOUS | Status: DC
  Administered 2022-02-07 – 2022-02-10 (×4): 1 g via INTRAVENOUS
  Filled 2022-02-07: qty 10
  Filled 2022-02-07 (×3): qty 1

## 2022-02-07 NOTE — Progress Notes (Signed)
Manufacturing engineer [facility]) Hospital Liaison Note ?  ?MSW contacted son [name] [phone] to follow up on his wishes for Hospice care. No answer. VM left providing contact info and purpose of call. MSW to await response.  ?  ?TOC & MD aware. MSW to follow up with family tomorrow following family's discussion with MD. ? ?Patient is currently not under review for hospice services. ?  ?Please do not hesitate to call with any hospice related questions.  ?  ?Thank you for the opportunity to participate in this patient's care. ?  ?Daphene Calamity, MSW ?[place]  ?[phone] ?

## 2022-02-07 NOTE — Progress Notes (Signed)
This nurse was not able to give due PM meds. PT is lethargic, arousable to name calling but would go back to sleep. Unable to follow commands. Latest HR: 131 bpm, BP: 91/75. NP Valetta Fuller made aware. Will continue to monitor. ?

## 2022-02-07 NOTE — Plan of Care (Signed)
Patient continues to be lethargic and unable to follow instructions to swallow safely.  NO AM could be meds given. ?

## 2022-02-07 NOTE — Progress Notes (Signed)
PT Cancellation Note ? ?Patient Details ?Name: [name] ?MRN: [id number] ?DOB: Mar 18, 1927 ? ? ?Cancelled Treatment:     Pt had just returned from head CT, remains lethargic and unable to participate. Will reassess next available date/time ? ?Josie Dixon ?02/07/2022, 3:13 PM ?

## 2022-02-07 NOTE — Progress Notes (Signed)
Initial Nutrition Assessment ? ?DOCUMENTATION CODES:  ? ?Non-severe (moderate) malnutrition in context of chronic illness ? ?INTERVENTION:  ? ?-Liberalize diet to regular ?-RD will follow up with further recommendations pendinf goals of care discussions ? ?NUTRITION DIAGNOSIS:  ? ?Moderate Malnutrition related to chronic illness (COPD) as evidenced by mild fat depletion, moderate fat depletion, mild muscle depletion, moderate muscle depletion. ? ?GOAL:  ? ?Patient will meet greater than or equal to 90% of their needs ? ?MONITOR:  ? ?PO intake, Supplement acceptance, Labs, Weight trends, Skin, I & O's ? ?REASON FOR ASSESSMENT:  ? ?Low [place] ?  ? ?ASSESSMENT:  ? ?[name] is a 88 y.o. male with medical history significant of dementia, CAD, hypertension, hyperlipidemia.  The patient went into the primary care physician's office with increased swelling of his lower extremities.  He was found to have a heart rate of 140 and referred over to the emergency room.  In the emergency room here initially was in SVT and was given IV diltiazem.  Patient converted over to atrial flutter.  Patient currently feels fine.  Heard an audible wheeze.  The patient has dementia and is not the best historian.  Son at the bedside able to help out with history ? ?Pt admitted with CHF.  ? ?Reviewed I/O's: -350 ml x 24 hours and -7.1 L since admission ? ?UOP: 350 ml x 24 hours ? ?Pt very lethargic at time of visit.  ? ?Case discussed with Heart Failure RN; pt has been lethargic. RD was unable to arouse pt during exam.  ? ?Per MD notes, pt with poor prognosis, Palliative care has been consulted. Pt considering hospice.  ? ?Pt has not been alert enough to take medications or PO's. Noted pt has not received PO medications today.  ?  ?Reviewed wt hx; noted distant history of weight loss.  ? ?Medications reviewed.  ? ?Labs reviewed: K: 3.1.  ? ?NUTRITION - FOCUSED PHYSICAL EXAM: ? ?Flowsheet Row Most Recent Value  ?Orbital Region  Moderate depletion  ?Upper Arm Region Mild depletion  ?Thoracic and Lumbar Region Mild depletion  ?Buccal Region Mild depletion  ?Temple Region Moderate depletion  ?Clavicle Bone Region Moderate depletion  ?Clavicle and Acromion Bone Region Moderate depletion  ?Scapular Bone Region Moderate depletion  ?Dorsal Hand Moderate depletion  ?Patellar Region Mild depletion  ?Anterior Thigh Region Mild depletion  ?Posterior Calf Region Mild depletion  ?Edema (RD Assessment) Mild  ?Hair Reviewed  ?Eyes Reviewed  ?Mouth Reviewed  ?Skin Reviewed  ?Nails Reviewed  ? ?  ? ? ?Diet Order:   ?Diet Order   ? ?       ?  Diet Heart Room service appropriate? Yes; Fluid consistency: Thin  Diet effective now       ?  ? ?  ?  ? ?  ? ? ?EDUCATION NEEDS:  ? ?Not appropriate for education at this time ? ?Skin:  Skin Assessment: Reviewed RN Assessment ? ?Last BM:  02/05/22 ? ?Height:  ? ?Ht Readings from Last 1 Encounters:  ?02/02/22 '5\' 11"'$  (1.803 m)  ? ? ?Weight:  ? ?Wt Readings from Last 1 Encounters:  ?02/07/22 66.4 kg  ? ? ?Ideal Body Weight:  78.2 kg ? ?BMI:  Body mass index is 20.42 kg/m?. ? ?Estimated Nutritional Needs:  ? ?Kcal:  1700-1900 ? ?Protein:  85-100 grams ? ?Fluid:  > 1.7 L ? ? ? ?Loistine Chance, RD, LDN, CDCES ?Registered Dietitian II ?Certified Diabetes Care and Education Specialist ?Please  refer to [facility] for RD and/or RD on-call/weekend/after hours pager  ?

## 2022-02-07 NOTE — Progress Notes (Signed)
Pt is still lethargic. Easily arousable to name calling but would go back to sleep. Unsafe to give due oral meds or food. Pt not able to follow commands. HR consistently on 120 to 130 bpm.PRN Metoprolol 2.'5mg'$  IV, one dose given with BP precaution. Will continue to monitor ?

## 2022-02-07 NOTE — Progress Notes (Signed)
?PROGRESS NOTE ? ? ? ?[name]  XYI:[id number] DOB: 1927-10-04 DOA: 02/01/2022 ?PCP: Dion Body, MD  ? ? ?Brief Narrative:  ? 88 year old man with history of dementia, hyperlipidemia, hypertension and cardiomyopathy and CAD.  Patient sent in by PMD with lower extremity edema and fast heart rate.  Initially found to have SVT but then converted over to atrial flutter rapid heart rate.  Patient was diuresed for congestive heart failure.  EF low at 20 to 25%.  Blood pressure on the lower side with having to go up on Coreg for heart rate.  We needed to add amiodarone to control heart rate better.  Held IV Lasix, spironolactone and ACE inhibitor secondary to acute kidney injury and relative hypotension.  The patient has had acute delirium for the last 3 days.    ? ?He is remained lethargic and encephalopathic.  Unable to safely take p.o. meds or food ? ? ?Assessment & Plan: ?  ?Principal Problem: ?  Acute on chronic systolic CHF (congestive heart failure) ([place]) ?Active Problems: ?  CHF (congestive heart failure) ([place]) ?  Dementia ([place]) ?  Essential hypertension ?  Atrial flutter with rapid ventricular response ([place]) ?  Hypokalemia ?  Delirium ?  AKI (acute kidney injury) ([place]) ?  Weakness ?  Malnutrition of moderate degree ? ?Acute delirium with underlying dementia ?Patient's been more lethargic since 4/3 ?Last dose of Haldol received 4/3 ?Nightly Zyprexa stopped ?Patient remains somewhat lethargic but is easily aroused and tends to be somewhat agitated on arousal ?Plan: ?Continue monitoring ?Avoid anticholinergics, benzodiazepines, narcotics, antipsychotics ? ?Atrial flutter with rapid ventricular response ?On Coreg and amiodarone ?Rates improved ?Continue rate control and Eliquis for anticoagulation ? ?Acute on chronic systolic congestive heart failure ?Currently hypotension precluding use of ACE inhibitor, Aldactone, diuretic ?Continue Coreg ? ?Acute kidney injury ?Improving ?Patient 7 L net  negative ?Check BMP in a.m. ?Restart home Aldactone ? ?Supraventricular tachycardia ?Now converted atrial flutter ?Continue telemetry ? ?Essential hypertension ?Continuing Coreg ?Other blood pressure medications on hold with relative hypotension ? ?Hypokalemia ?Monitor and replace ? ?Weakness ?Functional decline ?Therapy recommending rehab ?Delirium precluding safe discharge at this time ?Monitor and reevaluate once mental status is improved ?Given advanced age, degree of cardiomyopathy and overall functional decline hospice services has been engaged.  Hospice liaison currently evaluating patient will speak to family ? ? ?DVT prophylaxis: Eliquis ?Code Status: DNR ?Family Communication: Son via phone [phone] on 4/5, left VM on 4/6 ?Disposition Plan: Status is: Inpatient ?Remains inpatient appropriate because: Acute metabolic encephalopathy.  Acute decompensated heart failure.  Rapid atrial flutter ? ? ?Level of care: Progressive ? ?Consultants:  ?Cardiology ? ?Procedures:  ?None ? ?Antimicrobials: ?None ? ? ?Subjective: ?Seen and examined.  Lethargic and sleepy.  Awakens but does not participate in interview. ? ?Objective: ?Vitals:  ? 02/07/22 0431 02/07/22 0917 02/07/22 1056 02/07/22 1239  ?BP: 96/73 101/75  100/67  ?Pulse: (!) 108 (!) 131  (!) 125  ?Resp: 20 20    ?Temp: 97.9 ?F (36.6 ?C) 98.9 ?F (37.2 ?C)  99.3 ?F (37.4 ?C)  ?TempSrc:  Oral  Oral  ?SpO2: 93% 96%  91%  ?Weight:   66.4 kg   ?Height:      ? ? ?Intake/Output Summary (Last 24 hours) at 02/07/2022 1316 ?Last data filed at 02/07/2022 1025 ?Gross per 24 hour  ?Intake 0 ml  ?Output 150 ml  ?Net -150 ml  ? ?Filed Weights  ? 02/05/22 0416 02/06/22 0404 02/07/22 1056  ?Weight:  67.2 kg 66.5 kg 66.4 kg  ? ? ?Examination: ? ?General exam: No acute distress.  frail-appearing ?Respiratory system: Normal work of breathing.  Poor respiratory effort.  2 L.  Bibasilar crackles ?Cardiovascular system: Tachycardic, irregular rhythm, no murmurs ?Gastrointestinal  system: Soft, NT/ND, normal bowel sounds ?Central nervous system: Lethargic.  Unable to assess orientation ?Extremities: Unable to assess power ?Skin: No rashes, lesions or ulcers ?Psychiatry: Unable to assess ? ? ? ?Data Reviewed: I have personally reviewed following labs and imaging studies ? ?CBC: ?Recent Labs  ?Lab 02/01/22 ?1157 02/02/22 ?0501 02/07/22 ?4496  ?WBC 8.0 7.0 14.4*  ?NEUTROABS 5.6  --  11.7*  ?HGB 13.9 13.1 14.0  ?HCT 43.0 40.2 42.6  ?MCV 100.9* 99.5 97.3  ?PLT 140* 129* 138*  ? ?Basic Metabolic Panel: ?Recent Labs  ?Lab 02/02/22 ?0501 02/03/22 ?0453 02/04/22 ?7591 02/05/22 ?6384 02/07/22 ?6659  ?NA 142 141 141 141 143  ?K 3.8 3.3* 3.8 3.5 3.1*  ?CL 105 98 98 101 103  ?CO2 26 32 '29 27 28  '$ ?GLUCOSE 94 94 122* 117* 117*  ?BUN 24* 23 35* 42* 62*  ?CREATININE 0.84 1.06 1.33* 1.03 1.10  ?CALCIUM 8.8* 9.0 8.8* 8.4* 8.7*  ?MG  --   --   --   --  2.0  ? ?GFR: ?Estimated Creatinine Clearance: 39.4 mL/min (by C-G formula based on SCr of 1.1 mg/dL). ?Liver Function Tests: ?Recent Labs  ?Lab 02/01/22 ?1157  ?AST 33  ?ALT 32  ?ALKPHOS 92  ?BILITOT 1.4*  ?PROT 7.0  ?ALBUMIN 4.2  ? ?No results for input(s): LIPASE, AMYLASE in the last 168 hours. ?No results for input(s): AMMONIA in the last 168 hours. ?Coagulation Profile: ?No results for input(s): INR, PROTIME in the last 168 hours. ?Cardiac Enzymes: ?No results for input(s): CKTOTAL, CKMB, CKMBINDEX, TROPONINI in the last 168 hours. ?BNP (last 3 results) ?No results for input(s): PROBNP in the last 8760 hours. ?HbA1C: ?No results for input(s): HGBA1C in the last 72 hours. ?CBG: ?No results for input(s): GLUCAP in the last 168 hours. ?Lipid Profile: ?No results for input(s): CHOL, HDL, LDLCALC, TRIG, CHOLHDL, LDLDIRECT in the last 72 hours. ?Thyroid Function Tests: ?No results for input(s): TSH, T4TOTAL, FREET4, T3FREE, THYROIDAB in the last 72 hours. ?Anemia Panel: ?No results for input(s): VITAMINB12, FOLATE, FERRITIN, TIBC, IRON, RETICCTPCT in the last 72  hours. ?Sepsis Labs: ?Recent Labs  ?Lab 02/01/22 ?1417 02/02/22 ?0501 02/03/22 ?0453  ?PROCALCITON <0.10 <0.10 <0.10  ? ? ?No results found for this or any previous visit (from the past 240 hour(s)).  ? ? ? ? ? ?Radiology Studies: ?No results found. ? ? ? ? ? ?Scheduled Meds: ? amiodarone  200 mg Oral BID  ? Followed by  ? [START ON 02/11/2022] amiodarone  200 mg Oral Daily  ? apixaban  5 mg Oral BID  ? atorvastatin  20 mg Oral QPM  ? isosorbide mononitrate  30 mg Oral Daily  ? metoprolol tartrate  25 mg Oral BID  ? pantoprazole  40 mg Oral Daily  ? spironolactone  12.5 mg Oral Daily  ? ?Continuous Infusions: ? ? LOS: 6 days  ? ? ? ? ?Sidney Ace, MD ?[facility] ? ? ?If 7PM-7AM, please contact night-coverage ? ?02/07/2022, 1:16 PM  ? ?

## 2022-02-08 DIAGNOSIS — I5023 Acute on chronic systolic (congestive) heart failure: Secondary | ICD-10-CM | POA: Diagnosis not present

## 2022-02-08 LAB — CBC WITH DIFFERENTIAL/PLATELET
Abs Immature Granulocytes: 0.04 10*3/uL (ref 0.00–0.07)
Basophils Absolute: 0 10*3/uL (ref 0.0–0.1)
Basophils Relative: 0 %
Eosinophils Absolute: 0.1 10*3/uL (ref 0.0–0.5)
Eosinophils Relative: 0 %
HCT: 44.8 % (ref 39.0–52.0)
Hemoglobin: 14.6 g/dL (ref 13.0–17.0)
Immature Granulocytes: 0 %
Lymphocytes Relative: 7 %
Lymphs Abs: 1 10*3/uL (ref 0.7–4.0)
MCH: 32.6 pg (ref 26.0–34.0)
MCHC: 32.6 g/dL (ref 30.0–36.0)
MCV: 100 fL (ref 80.0–100.0)
Monocytes Absolute: 1.4 10*3/uL — ABNORMAL HIGH (ref 0.1–1.0)
Monocytes Relative: 11 %
Neutro Abs: 10.5 10*3/uL — ABNORMAL HIGH (ref 1.7–7.7)
Neutrophils Relative %: 82 %
Platelets: 173 10*3/uL (ref 150–400)
RBC: 4.48 MIL/uL (ref 4.22–5.81)
RDW: 14.7 % (ref 11.5–15.5)
WBC: 12.9 10*3/uL — ABNORMAL HIGH (ref 4.0–10.5)
nRBC: 0 % (ref 0.0–0.2)

## 2022-02-08 LAB — BASIC METABOLIC PANEL
Anion gap: 14 (ref 5–15)
BUN: 62 mg/dL — ABNORMAL HIGH (ref 8–23)
CO2: 25 mmol/L (ref 22–32)
Calcium: 9 mg/dL (ref 8.9–10.3)
Chloride: 108 mmol/L (ref 98–111)
Creatinine, Ser: 1.01 mg/dL (ref 0.61–1.24)
GFR, Estimated: >60 mL/min (ref >60)
Glucose, Bld: 98 mg/dL (ref 70–99)
Potassium: 3.5 mmol/L (ref 3.5–5.1)
Sodium: 147 mmol/L — ABNORMAL HIGH (ref 135–145)

## 2022-02-08 NOTE — Progress Notes (Signed)
Physical Therapy Treatment ?Patient Details ?Name: [name] ?MRN: [id number] ?DOB: 28-Mar-1927 ?Today's Date: 02/08/2022 ? ? ?History of Present Illness Pt is a 88 y/o M admitted on 02/01/22 after presenting to his PCP with c/o BLE edema & found to have HR in the 140s & was referred to the ER. In the ER pt was initially in SVT, was treated & converted to a-flutter. Pt is being treated for acute on chronic CHF. PMH: dementia, CAD, HTN, HLD, MI ? ?  ?PT Comments  ? ? Pt remains lethargic.  Son in room and OK'ed ROM for comfort.  He tolerated BLE PROM well with no resistance or signs of discomfort.  Minimal BUE ROM was given but pt did seem agitated by movements and did groan out at one point and did move RUE some.  It was stopped at that point.   ? ?Pt remains quite lethargic with no PO intake since Sunday per son.  Will continue to monitor but unless significant change in status anticipate discharge from PT services next session. ?  ?Recommendations for follow up therapy are one component of a multi-disciplinary discharge planning process, led by the attending physician.  Recommendations may be updated based on patient status, additional functional criteria and insurance authorization. ? ?Follow Up Recommendations ? Skilled nursing-short term rehab (<3 hours/day) ?  ?  ?Assistance Recommended at Discharge    ?Patient can return home with the following Two people to help with walking and/or transfers;Two people to help with bathing/dressing/bathroom;Direct supervision/assist for medications management;Help with stairs or ramp for entrance;Assist for transportation;Assistance with feeding;Assistance with cooking/housework;Direct supervision/assist for financial management ?  ?Equipment Recommendations ?    ?  ?Recommendations for Other Services   ? ? ?  ?Precautions / Restrictions Precautions ?Precautions: Fall ?Restrictions ?Weight Bearing Restrictions: No  ?  ? ?Mobility ? Bed Mobility ?  ?  ?  ?  ?  ?  ?  ?  ?   ? ?Transfers ?  ?  ?  ?  ?  ?  ?  ?  ?  ?  ?  ? ?Ambulation/Gait ?  ?  ?  ?  ?  ?  ?  ?  ? ? ?Stairs ?  ?  ?  ?  ?  ? ? ?Wheelchair Mobility ?  ? ?Modified Rankin (Stroke Patients Only) ?  ? ? ?  ?Balance   ?  ?  ?  ?  ?  ?  ?  ?  ?  ?  ?  ?  ?  ?  ?  ?  ?  ?  ?  ? ?  ?Cognition Arousal/Alertness: Lethargic ?Behavior During Therapy: [facility] for tasks assessed/performed ?Overall Cognitive Status: History of cognitive impairments - at baseline ?  ?  ?  ?  ?  ?  ?  ?  ?  ?  ?  ?  ?  ?  ?  ?  ?General Comments: slept though session with some signs of agitation/discomfort with UE PROM and it was limted due to responses. ?  ?  ? ?  ?Exercises Other Exercises ?Other Exercises: BLE PROM for comfort and skin integrity.  no signs if discomfort.  BUE PROM with grimmaing and groaning wich seemed to agitate pt so it was limtied and stopped. ? ?  ?General Comments   ?  ?  ? ?Pertinent Vitals/Pain Pain Assessment ?Pain Assessment: Faces ?Faces Pain Scale: Hurts even more ?Pain Location: with BUE PROM ?Pain  Descriptors / Indicators: Discomfort, Grimacing ?Pain Intervention(s): Limited activity within patient's tolerance, Repositioned  ? ? ?Home Living   ?  ?  ?  ?  ?  ?  ?  ?  ?  ?   ?  ?Prior Function    ?  ?  ?   ? ?PT Goals (current goals can now be found in the care plan section) Progress towards PT goals: Not progressing toward goals - comment ? ?  ?Frequency ? ? ? Min 2X/week ? ? ? ?  ?PT Plan Discharge plan needs to be updated  ? ? ?Co-evaluation   ?  ?  ?  ?  ? ?  ?AM-PAC PT "6 Clicks" Mobility   ?Outcome Measure ? Help needed turning from your back to your side while in a flat bed without using bedrails?: Total ?Help needed moving from lying on your back to sitting on the side of a flat bed without using bedrails?: Total ?Help needed moving to and from a bed to a chair (including a wheelchair)?: Total ?Help needed standing up from a chair using your arms (e.g., wheelchair or bedside chair)?: Total ?Help needed to walk in  hospital room?: Total ?Help needed climbing 3-5 steps with a railing? : Total ?6 Click Score: 6 ? ?  ?End of Session   ?Activity Tolerance: Patient limited by lethargy ?Patient left: in bed;with call bell/phone within reach;with bed alarm set ?Nurse Communication: Mobility status ?PT Visit Diagnosis: Unsteadiness on feet (R26.81);Muscle weakness (generalized) (M62.81);Difficulty in walking, not elsewhere classified (R26.2);Other abnormalities of gait and mobility (R26.89);History of falling (Z91.81) ?  ? ? ?Time: 1102-1110 ?PT Time Calculation (min) (ACUTE ONLY): 8 min ? ?Charges:  $Therapeutic Exercise: 8-22 mins          ?         Chesley Noon, PTA ?02/08/22, 11:16 AM ? ?

## 2022-02-08 NOTE — Progress Notes (Signed)
?  ?  Palliative Medicine Team ? ? ? ?Palliative Medicine consult noted. Due to high referral volume, there may be a delay seeing this patient. Please call the Palliative Medicine Team office at [phone] if recommendations are needed in the interim. A provider will be available on site 02/11/2022. If patient is expected to discharge before this date, please make outpatient palliative care referral as appropriate.  ?  ? ?  ? ?Thank you for inviting [place] to participate in the care of this patient. ?  ?Damian Leavell, MSN, RN ?Palliative Medicine Team  ?

## 2022-02-08 NOTE — Progress Notes (Signed)
?PROGRESS NOTE ? ? ? ?[name]  ZDG:[id number] DOB: 02/05/1927 DOA: 02/01/2022 ?PCP: Dion Body, MD  ? ? ?Brief Narrative:  ? 88 year old man with history of dementia, hyperlipidemia, hypertension and cardiomyopathy and CAD.  Patient sent in by PMD with lower extremity edema and fast heart rate.  Initially found to have SVT but then converted over to atrial flutter rapid heart rate.  Patient was diuresed for congestive heart failure.  EF low at 20 to 25%.  Blood pressure on the lower side with having to go up on Coreg for heart rate.  We needed to add amiodarone to control heart rate better.  Held IV Lasix, spironolactone and ACE inhibitor secondary to acute kidney injury and relative hypotension.  The patient has had acute delirium for the last 3 days.    ? ?Patient is remained lethargic and encephalopathic.  On 4/7 had a meeting with the patient's son [name] at bedside.  Reviewed images, clinical progression, natural progression of underlying chronic disease.  All questions were answered.  Decision made to proceed with hospice referral.  Hospice liaison engaged and will reach out to patient's son ? ? ?Assessment & Plan: ?  ?Principal Problem: ?  Acute on chronic systolic CHF (congestive heart failure) ([place]) ?Active Problems: ?  CHF (congestive heart failure) ([place]) ?  Dementia ([place]) ?  Essential hypertension ?  Atrial flutter with rapid ventricular response ([place]) ?  Hypokalemia ?  Delirium ?  AKI (acute kidney injury) ([place]) ?  Weakness ?  Malnutrition of moderate degree ? ?Acute delirium with underlying dementia ?Patient's been more lethargic since 4/3 ?Last dose of Haldol received 4/3 ?Nightly Zyprexa stopped ?Patient remains somewhat lethargic but is easily aroused and tends to be somewhat agitated on arousal ?Lethargy can no longer be attributed to medications ?Unable to fully rule out urinary tract infection ?CT head with significant volume loss ?Suspect progression of underlying cognitive  decline/dementia ?Plan: ?IV Rocephin ?Follow urine culture ?Continue monitoring ?Avoid anticholinergics, benzodiazepines, narcotics, antipsychotics ?Hospice referral ? ?Atrial flutter with rapid ventricular response ?On Coreg and amiodarone ?Remains somewhat tachycardic but asymptomatic ?Continue rate control and Eliquis for anticoagulation ?No escalation of care ? ?Acute on chronic systolic congestive heart failure ?Currently hypotension precluding use of ACE inhibitor, Aldactone, diuretic ?Continue Coreg ? ?Acute kidney injury ?Improving ?Patient 7 L net negative ?Continue home Aldactone if able to take p.o. ?No further lab work-up at this time ? ?Supraventricular tachycardia ?Now converted atrial flutter ?Continue telemetry ? ?Essential hypertension ?Continuing Coreg ?Other blood pressure medications on hold with relative hypotension ? ?Hypokalemia ?Monitor and replace ? ?Weakness ?Functional decline ?Therapy recommending rehab ?Delirium precluding safe discharge at this time ?Monitor and reevaluate once mental status is improved ?Given advanced age, degree of cardiomyopathy and overall functional decline hospice services has been engaged.  Hospice liaison currently evaluating patient will speak to family ?-4/7: Lengthy discussion with patient's son [name] at bedside.  Explained acute disease process and suspected progression of underlying cognitive decline.  Patient's son is in agreement that referral to hospice is appropriate at this time.  Hospice liaison [place] aware and will reach out to patient's son.  We will continue current scope of treatment for now without escalation ? ? ?DVT prophylaxis: Eliquis ?Code Status: DNR ?Family Communication: Son via phone [phone] on 4/5, left VM on 4/6, son [name] at bedside 4/7 ?Disposition Plan: Status is: Inpatient ?Remains inpatient appropriate because: Acute metabolic encephalopathy.  Acute decompensated heart failure.  Rapid atrial flutter.  Proceeding  with hospice  referral. ? ? ?Level of care: Progressive ? ?Consultants:  ?Cardiology ? ?Procedures:  ?None ? ?Antimicrobials: ?None ? ? ?Subjective: ?Seen and examined.  Lethargic and sleepy.  Awakens but does not participate in interview. ? ?Objective: ?Vitals:  ? 02/08/22 0334 02/08/22 0500 02/08/22 0754 02/08/22 1136  ?BP: 101/73  (!) 88/67 96/82  ?Pulse: (!) 135  (!) 110 (!) 119  ?Resp: '14  14 19  '$ ?Temp: 98 ?F (36.7 ?C)  98.1 ?F (36.7 ?C) 98.5 ?F (36.9 ?C)  ?TempSrc:      ?SpO2: 95%  95% 98%  ?Weight:  65 kg    ?Height:      ? ? ?Intake/Output Summary (Last 24 hours) at 02/08/2022 1227 ?Last data filed at 02/08/2022 0400 ?Gross per 24 hour  ?Intake 95.3 ml  ?Output 800 ml  ?Net -704.7 ml  ? ?Filed Weights  ? 02/06/22 0404 02/07/22 1056 02/08/22 0500  ?Weight: 66.5 kg 66.4 kg 65 kg  ? ? ?Examination: ? ?General exam: No acute distress.  Appears frail ?Respiratory system: Poor respiratory effort.  Bibasilar crackles.  Normal work of breathing.  2 L ?Cardiovascular system: Tachycardic, irregular rhythm, no murmurs ?Gastrointestinal system: Soft, NT/ND, normal bowel sounds ?Central nervous system: Lethargic.  Unable to assess orientation ?Extremities: Unable to assess power ?Skin: No rashes, lesions or ulcers ?Psychiatry: Unable to assess ? ? ? ?Data Reviewed: I have personally reviewed following labs and imaging studies ? ?CBC: ?Recent Labs  ?Lab 02/02/22 ?0501 02/07/22 ?4650 02/08/22 ?3546  ?WBC 7.0 14.4* 12.9*  ?NEUTROABS  --  11.7* 10.5*  ?HGB 13.1 14.0 14.6  ?HCT 40.2 42.6 44.8  ?MCV 99.5 97.3 100.0  ?PLT 129* 138* 173  ? ?Basic Metabolic Panel: ?Recent Labs  ?Lab 02/03/22 ?0453 02/04/22 ?5681 02/05/22 ?2751 02/07/22 ?7001 02/08/22 ?7494  ?NA 141 141 141 143 147*  ?K 3.3* 3.8 3.5 3.1* 3.5  ?CL 98 98 101 103 108  ?CO2 32 '29 27 28 25  '$ ?GLUCOSE 94 122* 117* 117* 98  ?BUN 23 35* 42* 62* 62*  ?CREATININE 1.06 1.33* 1.03 1.10 1.01  ?CALCIUM 9.0 8.8* 8.4* 8.7* 9.0  ?MG  --   --   --  2.0  --   ? ?GFR: ?Estimated Creatinine Clearance:  42 mL/min (by C-G formula based on SCr of 1.01 mg/dL). ?Liver Function Tests: ?No results for input(s): AST, ALT, ALKPHOS, BILITOT, PROT, ALBUMIN in the last 168 hours. ? ?No results for input(s): LIPASE, AMYLASE in the last 168 hours. ?No results for input(s): AMMONIA in the last 168 hours. ?Coagulation Profile: ?No results for input(s): INR, PROTIME in the last 168 hours. ?Cardiac Enzymes: ?No results for input(s): CKTOTAL, CKMB, CKMBINDEX, TROPONINI in the last 168 hours. ?BNP (last 3 results) ?No results for input(s): PROBNP in the last 8760 hours. ?HbA1C: ?No results for input(s): HGBA1C in the last 72 hours. ?CBG: ?No results for input(s): GLUCAP in the last 168 hours. ?Lipid Profile: ?No results for input(s): CHOL, HDL, LDLCALC, TRIG, CHOLHDL, LDLDIRECT in the last 72 hours. ?Thyroid Function Tests: ?No results for input(s): TSH, T4TOTAL, FREET4, T3FREE, THYROIDAB in the last 72 hours. ?Anemia Panel: ?No results for input(s): VITAMINB12, FOLATE, FERRITIN, TIBC, IRON, RETICCTPCT in the last 72 hours. ?Sepsis Labs: ?Recent Labs  ?Lab 02/01/22 ?1417 02/02/22 ?0501 02/03/22 ?0453  ?PROCALCITON <0.10 <0.10 <0.10  ? ? ?No results found for this or any previous visit (from the past 240 hour(s)).  ? ? ? ? ? ?Radiology Studies: ?CT HEAD WO  CONTRAST (5MM) ? ?Result Date: 02/07/2022 ?CLINICAL DATA:  Mental status change, unknown cause. EXAM: CT HEAD WITHOUT CONTRAST TECHNIQUE: Contiguous axial images were obtained from the base of the skull through the vertex without intravenous contrast. RADIATION DOSE REDUCTION: This exam was performed according to the departmental dose-optimization program which includes automated exposure control, adjustment of the mA and/or kV according to patient size and/or use of iterative reconstruction technique. COMPARISON:  May 04, 2021 FINDINGS: Brain: Age-related global parenchymal volume loss with ex vacuo dilatation of the ventricular system is unchanged from prior. Similar advanced burden  of chronic white matter ischemic disease. No evidence of acute large vascular territory infarction, hemorrhage, hydrocephalus, extra-axial collection or mass lesion/mass effect. Vascular: Calcifications of t

## 2022-02-08 NOTE — Progress Notes (Signed)
Nutrition Brief Note  Chart reviewed. Pt now transitioning to comfort care.  No further nutrition interventions planned at this time.  Please re-consult as needed.   Hyla Coard W, RD, LDN, CDCES Registered Dietitian II Certified Diabetes Care and Education Specialist Please refer to [facility] for RD and/or RD on-call/weekend/after hours pager   

## 2022-02-08 NOTE — Progress Notes (Signed)
Manufacturing engineer [facility]) Hospital Liaison Note ? ?Received request from [employer] [place] for family interest in [facility]. Visited patient at bedside and spoke with Rip Harbour to confirm interest and explain services. ? ?Approval for Hospice Home is determined by [facility] MD. Once [facility] MD has determined Hospice Home eligibility, [place] will update hospital staff and family. Patient has been approved for Hospice Home. ? ?Please do not hesitate to call with any hospice related questions.  ?  ?Thank you for the opportunity to participate in this patient's care. ? ?Daphene Calamity, MSW ?[place]  ?[phone] ? ?

## 2022-02-09 DIAGNOSIS — I5023 Acute on chronic systolic (congestive) heart failure: Secondary | ICD-10-CM | POA: Diagnosis not present

## 2022-02-09 NOTE — Progress Notes (Signed)
Manufacturing engineer (ACC) ? ?There is not a bed at [facility] for Mr. [name] today. ? ?ACC will update hospital and family once our bed status changes. ? ?Venia Carbon BSN, RN ?[facility] Liaison  ? ? ?

## 2022-02-09 NOTE — Progress Notes (Signed)
?PROGRESS NOTE ? ? ? ?[name]  ALP:[id number] DOB: 1927-07-22 DOA: 02/01/2022 ?PCP: Dion Body, MD  ? ? ?Brief Narrative:  ? 88 year old man with history of dementia, hyperlipidemia, hypertension and cardiomyopathy and CAD.  Patient sent in by PMD with lower extremity edema and fast heart rate.  Initially found to have SVT but then converted over to atrial flutter rapid heart rate.  Patient was diuresed for congestive heart failure.  EF low at 20 to 25%.  Blood pressure on the lower side with having to go up on Coreg for heart rate.  We needed to add amiodarone to control heart rate better.  Held IV Lasix, spironolactone and ACE inhibitor secondary to acute kidney injury and relative hypotension.  The patient has had acute delirium for the last 3 days.    ? ?Patient is remained lethargic and encephalopathic.  On 4/7 had a meeting with the patient's son [name] at bedside.  Reviewed images, clinical progression, natural progression of underlying chronic disease.  All questions were answered.  Decision made to proceed with hospice referral.  Hospice liaison engaged and will reach out to patient's son ? ?Patient was accepted to residential hospice facility.  As of 4/8 there is not an available bed at the hospice home.  Hospice liaison will reach out to family once bed status changes ? ? ?Assessment & Plan: ?  ?Principal Problem: ?  Acute on chronic systolic CHF (congestive heart failure) ([place]) ?Active Problems: ?  CHF (congestive heart failure) ([place]) ?  Dementia ([place]) ?  Essential hypertension ?  Atrial flutter with rapid ventricular response ([place]) ?  Hypokalemia ?  Delirium ?  AKI (acute kidney injury) ([place]) ?  Weakness ?  Malnutrition of moderate degree ? ?Acute delirium with underlying dementia ?Patient's been more lethargic since 4/3 ?Last dose of Haldol received 4/3 ?Nightly Zyprexa stopped ?Patient remains somewhat lethargic but is easily aroused and tends to be somewhat agitated on  arousal ?Lethargy can no longer be attributed to medications ?Unable to fully rule out urinary tract infection ?CT head with significant volume loss ?Suspect progression of underlying cognitive decline/dementia ?Patient accepted to hospice house on 4/7 ?Plan: ?Continue current scope of treatment for now with IV antibiotics.  Avoid unnecessary anticholinergics and sedatives.  Hospice referral in place.  Patient has been accepted to hospice home.  Currently no bed available.  Hospice liaison engaged and will notify medical team once bed is available.  DNR status. ? ? ?Atrial flutter with rapid ventricular response ?On Coreg and amiodarone ?Remains somewhat tachycardic but asymptomatic ?Continue rate control and Eliquis for anticoagulation ?No escalation of care ? ?Acute on chronic systolic congestive heart failure ?Currently hypotension precluding use of ACE inhibitor, Aldactone, diuretic ?Continue Coreg ? ?Acute kidney injury ?Improving ?Patient 7 L net negative ?Continue home Aldactone if able to take p.o. ?No further lab work-up at this time ? ?Supraventricular tachycardia ?Now converted atrial flutter ?Continue telemetry ? ?Essential hypertension ?Continuing Coreg ?Other blood pressure medications on hold with relative hypotension ? ?Hypokalemia ?Monitor and replace ? ?Weakness ?Functional decline ?Therapy recommending rehab ?Delirium precluding safe discharge at this time ?Monitor and reevaluate once mental status is improved ?Given advanced age, degree of cardiomyopathy and overall functional decline hospice services has been engaged.  Hospice liaison currently evaluating patient will speak to family ?-4/7: Lengthy discussion with patient's son [name] at bedside.  Explained acute disease process and suspected progression of underlying cognitive decline.  Patient's son is in agreement that referral to  hospice is appropriate at this time.  Hospice liaison [place] aware and will reach out to patient's son.  We will  continue current scope of treatment for now without escalation ? ? ?DVT prophylaxis: Eliquis ?Code Status: DNR ?Family Communication: Son via phone [phone] on 4/5, left VM on 4/6, son [name] at bedside 4/7 ?Disposition Plan: Status is: Inpatient ?Remains inpatient appropriate because: Acute metabolic encephalopathy.  Acute decompensated heart failure.  Rapid atrial flutter.  Proceeding with hospice referral. ? ? ?Level of care: Progressive ? ?Consultants:  ?Cardiology ? ?Procedures:  ?None ? ?Antimicrobials: ?None ? ? ?Subjective: ?Seen and examined.  Lethargic and sleepy.  Awakens but does not participate in interview. ? ?Objective: ?Vitals:  ? 02/09/22 0745 02/09/22 0750 02/09/22 0858 02/09/22 1135  ?BP: 112/80   (!) 87/73  ?Pulse: (!) 137 (!) 119 (!) 132 (!) 110  ?Resp: 16   16  ?Temp: 98.4 ?F (36.9 ?C)   98.2 ?F (36.8 ?C)  ?TempSrc:      ?SpO2: 98%   92%  ?Weight:      ?Height:      ? ? ?Intake/Output Summary (Last 24 hours) at 02/09/2022 1235 ?Last data filed at 02/09/2022 1015 ?Gross per 24 hour  ?Intake 0 ml  ?Output 450 ml  ?Net -450 ml  ? ?Filed Weights  ? 02/07/22 1056 02/08/22 0500 02/09/22 0332  ?Weight: 66.4 kg 65 kg 64 kg  ? ? ?Examination: ? ?General exam: No acute distress.  Appears frail ?Respiratory system: Poor respiratory effort.  Bibasilar crackles.  Normal work of breathing.  2 L ?Cardiovascular system: Tachycardic, irregular rhythm, no murmurs ?Gastrointestinal system: Soft, NT/ND, normal bowel sounds ?Central nervous system: Lethargic.  Unable to assess orientation ?Extremities: Unable to assess power ?Skin: No rashes, lesions or ulcers ?Psychiatry: Unable to assess ? ? ? ?Data Reviewed: I have personally reviewed following labs and imaging studies ? ?CBC: ?Recent Labs  ?Lab 02/07/22 ?0742 02/08/22 ?0946  ?WBC 14.4* 12.9*  ?NEUTROABS 11.7* 10.5*  ?HGB 14.0 14.6  ?HCT 42.6 44.8  ?MCV 97.3 100.0  ?PLT 138* 173  ? ?Basic Metabolic Panel: ?Recent Labs  ?Lab 02/03/22 ?0453 02/04/22 ?6553  02/05/22 ?7482 02/07/22 ?7078 02/08/22 ?6754  ?NA 141 141 141 143 147*  ?K 3.3* 3.8 3.5 3.1* 3.5  ?CL 98 98 101 103 108  ?CO2 32 '29 27 28 25  '$ ?GLUCOSE 94 122* 117* 117* 98  ?BUN 23 35* 42* 62* 62*  ?CREATININE 1.06 1.33* 1.03 1.10 1.01  ?CALCIUM 9.0 8.8* 8.4* 8.7* 9.0  ?MG  --   --   --  2.0  --   ? ?GFR: ?Estimated Creatinine Clearance: 41.4 mL/min (by C-G formula based on SCr of 1.01 mg/dL). ?Liver Function Tests: ?No results for input(s): AST, ALT, ALKPHOS, BILITOT, PROT, ALBUMIN in the last 168 hours. ? ?No results for input(s): LIPASE, AMYLASE in the last 168 hours. ?No results for input(s): AMMONIA in the last 168 hours. ?Coagulation Profile: ?No results for input(s): INR, PROTIME in the last 168 hours. ?Cardiac Enzymes: ?No results for input(s): CKTOTAL, CKMB, CKMBINDEX, TROPONINI in the last 168 hours. ?BNP (last 3 results) ?No results for input(s): PROBNP in the last 8760 hours. ?HbA1C: ?No results for input(s): HGBA1C in the last 72 hours. ?CBG: ?No results for input(s): GLUCAP in the last 168 hours. ?Lipid Profile: ?No results for input(s): CHOL, HDL, LDLCALC, TRIG, CHOLHDL, LDLDIRECT in the last 72 hours. ?Thyroid Function Tests: ?No results for input(s): TSH, T4TOTAL, FREET4, T3FREE, THYROIDAB in the  last 72 hours. ?Anemia Panel: ?No results for input(s): VITAMINB12, FOLATE, FERRITIN, TIBC, IRON, RETICCTPCT in the last 72 hours. ?Sepsis Labs: ?Recent Labs  ?Lab 02/03/22 ?1884  ?PROCALCITON <0.10  ? ? ?Recent Results (from the past 240 hour(s))  ?Urine Culture     Status: Abnormal (Preliminary result)  ? Collection Time: 02/07/22  3:33 PM  ? Specimen: Urine, Clean Catch  ?Result Value Ref Range Status  ? Specimen Description   Final  ?  URINE, CLEAN CATCH ?Performed at [facility], [address], [place], [place] [address] ?  ? Special Requests   Final  ?  NONE ?Performed at [facility], [address] [place], [place] [address] ?  ? Culture (A)  Final  ?  10,000 COLONIES/mL  ESCHERICHIA COLI ?SUSCEPTIBILITIES TO FOLLOW ?Performed at [facility], [place] [address] [place], [place] [address] ?  ? Report Status PENDING  Incomplete  ?  ? ? ? ? ? ?Radiology Studies: ?CT HEAD WO CONTRAST (5MM) ? ?Result

## 2022-02-10 DIAGNOSIS — I5023 Acute on chronic systolic (congestive) heart failure: Secondary | ICD-10-CM | POA: Diagnosis not present

## 2022-02-10 LAB — URINE CULTURE: Culture: 10000 — AB

## 2022-02-10 NOTE — Progress Notes (Signed)
?PROGRESS NOTE ? ? ? ?[name]  WNU:[id number] DOB: June 24, 1927 DOA: 02/01/2022 ?PCP: Dion Body, MD  ? ? ?Brief Narrative:  ? 88 year old man with history of dementia, hyperlipidemia, hypertension and cardiomyopathy and CAD.  Patient sent in by PMD with lower extremity edema and fast heart rate.  Initially found to have SVT but then converted over to atrial flutter rapid heart rate.  Patient was diuresed for congestive heart failure.  EF low at 20 to 25%.  Blood pressure on the lower side with having to go up on Coreg for heart rate.  We needed to add amiodarone to control heart rate better.  Held IV Lasix, spironolactone and ACE inhibitor secondary to acute kidney injury and relative hypotension.  The patient has had acute delirium for the last 3 days.    ? ?Patient is remained lethargic and encephalopathic.  On 4/7 had a meeting with the patient's son [name] at bedside.  Reviewed images, clinical progression, natural progression of underlying chronic disease.  All questions were answered.  Decision made to proceed with hospice referral.  Hospice liaison engaged and will reach out to patient's son ? ?Patient was accepted to residential hospice facility.  As of 4/9 there is not an available bed at the hospice home.  Hospice liaison will reach out to family once bed status changes ? ? ?Assessment & Plan: ?  ?Principal Problem: ?  Acute on chronic systolic CHF (congestive heart failure) ([place]) ?Active Problems: ?  CHF (congestive heart failure) ([place]) ?  Dementia ([place]) ?  Essential hypertension ?  Atrial flutter with rapid ventricular response ([place]) ?  Hypokalemia ?  Delirium ?  AKI (acute kidney injury) ([place]) ?  Weakness ?  Malnutrition of moderate degree ? ?Acute delirium with underlying dementia ?Patient's been more lethargic since 4/3 ?Last dose of Haldol received 4/3 ?Nightly Zyprexa stopped ?Patient remains somewhat lethargic but is easily aroused and tends to be somewhat agitated on  arousal ?Lethargy can no longer be attributed to medications ?Unable to fully rule out urinary tract infection ?CT head with significant volume loss ?Suspect progression of underlying cognitive decline/dementia ?Patient accepted to hospice house on 4/7 ?Plan: ?Continue current scope of treatment for now with IV antibiotics.  IV Rocephin for 5-day stop date.  Avoid unnecessary anticholinergics and sedatives.  Hospice referral in place.  Patient has been accepted to hospice home.  Currently no bed available.  Hospice liaison engaged and will notify medical team once bed is available.  DNR status. ? ? ?Atrial flutter with rapid ventricular response ?On Coreg and amiodarone ?Remains somewhat tachycardic but asymptomatic ?Continue rate control and Eliquis for anticoagulation ?No escalation of care ?Can DC telemetry ? ?Acute on chronic systolic congestive heart failure ?Currently hypotension precluding use of ACE inhibitor, Aldactone, diuretic ?Continue Coreg ? ?Acute kidney injury ?Improving ?Patient 7 L net negative ?Continue home Aldactone if able to take p.o. ?No further lab work-up at this time ? ?Supraventricular tachycardia ?Now converted atrial flutter ?Continue telemetry ? ?Essential hypertension ?Continuing Coreg ?Other blood pressure medications on hold with relative hypotension ? ?Hypokalemia ?Monitor and replace ? ?Weakness ?Functional decline ?Therapy recommending rehab ?Delirium precluding safe discharge at this time ?Monitor and reevaluate once mental status is improved ?Given advanced age, degree of cardiomyopathy and overall functional decline hospice services has been engaged.  Hospice liaison currently evaluating patient will speak to family ?-4/7: Lengthy discussion with patient's son [name] at bedside.  Explained acute disease process and suspected progression of underlying cognitive  decline.  Patient's son is in agreement that referral to hospice is appropriate at this time.  Hospice liaison [place]  aware and will reach out to patient's son.  We will continue current scope of treatment for now without escalation ? ? ?DVT prophylaxis: Eliquis ?Code Status: DNR ?Family Communication: Son via phone [phone] on 4/5, left VM on 4/6, son [name] at bedside 4/7, left VM on 4/9 ?Disposition Plan: Status is: Inpatient ?Remains inpatient appropriate because: Unsafe discharge plan.  Pending hospice versus home bed availability. ? ? ?Level of care: Progressive ? ?Consultants:  ?Cardiology ? ?Procedures:  ?None ? ?Antimicrobials: ?None ? ? ?Subjective: ?Seen and examined.  Lethargic and sleepy.  Awakens but does not participate in interview. ? ?Objective: ?Vitals:  ? 02/10/22 0100 02/10/22 0312 02/10/22 0359 02/10/22 0829  ?BP:  109/86  109/76  ?Pulse: 65 (!) 127 68 72  ?Resp:    18  ?Temp:  98 ?F (36.7 ?C)  98.7 ?F (37.1 ?C)  ?TempSrc:  Oral    ?SpO2:  92%  91%  ?Weight:  62.7 kg    ?Height:      ? ? ?Intake/Output Summary (Last 24 hours) at 02/10/2022 1138 ?Last data filed at 02/10/2022 1013 ?Gross per 24 hour  ?Intake 139.26 ml  ?Output 350 ml  ?Net -210.74 ml  ? ?Filed Weights  ? 02/08/22 0500 02/09/22 0332 02/10/22 0312  ?Weight: 65 kg 64 kg 62.7 kg  ? ? ?Examination: ? ?General exam: No acute distress.  Appears frail ?Respiratory system: Poor respiratory effort.  Bibasilar crackles.  Normal work of breathing.  2 L ?Cardiovascular system: Tachycardic, irregular rhythm, no murmurs ?Gastrointestinal system: Soft, NT/ND, normal bowel sounds ?Central nervous system: Lethargic.  Unable to assess orientation ?Extremities: Unable to assess power ?Skin: No rashes, lesions or ulcers ?Psychiatry: Unable to assess ? ? ? ?Data Reviewed: I have personally reviewed following labs and imaging studies ? ?CBC: ?Recent Labs  ?Lab 02/07/22 ?0742 02/08/22 ?0946  ?WBC 14.4* 12.9*  ?NEUTROABS 11.7* 10.5*  ?HGB 14.0 14.6  ?HCT 42.6 44.8  ?MCV 97.3 100.0  ?PLT 138* 173  ? ?Basic Metabolic Panel: ?Recent Labs  ?Lab 02/04/22 ?5537 02/05/22 ?4827  02/07/22 ?0786 02/08/22 ?7544  ?NA 141 141 143 147*  ?K 3.8 3.5 3.1* 3.5  ?CL 98 101 103 108  ?CO2 '29 27 28 25  '$ ?GLUCOSE 122* 117* 117* 98  ?BUN 35* 42* 62* 62*  ?CREATININE 1.33* 1.03 1.10 1.01  ?CALCIUM 8.8* 8.4* 8.7* 9.0  ?MG  --   --  2.0  --   ? ?GFR: ?Estimated Creatinine Clearance: 40.5 mL/min (by C-G formula based on SCr of 1.01 mg/dL). ?Liver Function Tests: ?No results for input(s): AST, ALT, ALKPHOS, BILITOT, PROT, ALBUMIN in the last 168 hours. ? ?No results for input(s): LIPASE, AMYLASE in the last 168 hours. ?No results for input(s): AMMONIA in the last 168 hours. ?Coagulation Profile: ?No results for input(s): INR, PROTIME in the last 168 hours. ?Cardiac Enzymes: ?No results for input(s): CKTOTAL, CKMB, CKMBINDEX, TROPONINI in the last 168 hours. ?BNP (last 3 results) ?No results for input(s): PROBNP in the last 8760 hours. ?HbA1C: ?No results for input(s): HGBA1C in the last 72 hours. ?CBG: ?No results for input(s): GLUCAP in the last 168 hours. ?Lipid Profile: ?No results for input(s): CHOL, HDL, LDLCALC, TRIG, CHOLHDL, LDLDIRECT in the last 72 hours. ?Thyroid Function Tests: ?No results for input(s): TSH, T4TOTAL, FREET4, T3FREE, THYROIDAB in the last 72 hours. ?Anemia Panel: ?No results for input(s):  VITAMINB12, FOLATE, FERRITIN, TIBC, IRON, RETICCTPCT in the last 72 hours. ?Sepsis Labs: ?No results for input(s): PROCALCITON, LATICACIDVEN in the last 168 hours. ? ? ?Recent Results (from the past 240 hour(s))  ?Urine Culture     Status: Abnormal  ? Collection Time: 02/07/22  3:33 PM  ? Specimen: Urine, Clean Catch  ?Result Value Ref Range Status  ? Specimen Description   Final  ?  URINE, CLEAN CATCH ?Performed at [facility], [address] [place], [place] [address] ?  ? Special Requests   Final  ?  NONE ?Performed at [facility], [address], [place], [place] [address] ?  ? Culture 10,000 COLONIES/mL ESCHERICHIA COLI (A)  Final  ? Report Status 02/10/2022 FINAL  Final  ?  Organism ID, Bacteria ESCHERICHIA COLI (A)  Final  ?    Susceptibility  ? Escherichia coli - MIC*  ?  AMPICILLIN >=32 RESISTANT Resistant   ?  CEFAZOLIN <=4 SENSITIVE Sensitive   ?  CEFEPIME <=0.12 SENSITIVE Sensitive

## 2022-02-10 NOTE — Progress Notes (Signed)
Manufacturing engineer (ACC) ?  ?There is not a bed at [facility] for Mr. Glory Rosebush today. ?  ?ACC will update hospital and family once our bed status changes. ?  ?Venia Carbon BSN, RN ?[facility] Liaison  ?

## 2022-02-11 DIAGNOSIS — I5023 Acute on chronic systolic (congestive) heart failure: Secondary | ICD-10-CM | POA: Diagnosis not present

## 2022-02-11 MED ORDER — ONDANSETRON HCL 4 MG/2ML IJ SOLN: 4.0000 mg | Freq: Four times a day (QID) | INTRAMUSCULAR | Status: DC | PRN

## 2022-02-11 MED ORDER — BIOTENE DRY MOUTH MT LIQD: 15.0000 mL | OROMUCOSAL | Status: DC | PRN

## 2022-02-11 MED ORDER — GLYCOPYRROLATE 1 MG PO TABS
1.0000 mg | ORAL_TABLET | ORAL | Status: DC | PRN
  Filled 2022-02-11: qty 1

## 2022-02-11 MED ORDER — GLYCOPYRROLATE 0.2 MG/ML IJ SOLN
0.2000 mg | INTRAMUSCULAR | Status: DC | PRN
  Filled 2022-02-11: qty 1

## 2022-02-11 MED ORDER — BISACODYL 10 MG RE SUPP: 10.0000 mg | Freq: Every day | RECTAL | Status: DC | PRN

## 2022-02-11 MED ORDER — POLYVINYL ALCOHOL 1.4 % OP SOLN: 1.0000 [drp] | Freq: Four times a day (QID) | OPHTHALMIC | Status: DC | PRN

## 2022-02-11 MED ORDER — LORAZEPAM 2 MG/ML IJ SOLN: 1.0000 mg | INTRAMUSCULAR | Status: DC | PRN

## 2022-02-11 MED ORDER — LORAZEPAM 1 MG PO TABS
1.0000 mg | ORAL_TABLET | ORAL | Status: DC | PRN
Start: 2022-02-11 — End: 2022-02-13

## 2022-02-11 MED ORDER — ACETAMINOPHEN 650 MG RE SUPP: 650.0000 mg | Freq: Four times a day (QID) | RECTAL | Status: DC | PRN

## 2022-02-11 MED ORDER — ACETAMINOPHEN 325 MG PO TABS
650.0000 mg | ORAL_TABLET | Freq: Four times a day (QID) | ORAL | Status: DC | PRN
Start: 2022-02-11 — End: 2022-02-13

## 2022-02-11 MED ORDER — MORPHINE SULFATE (CONCENTRATE) 10 MG/0.5ML PO SOLN
5.0000 mg | ORAL | Status: DC | PRN
  Filled 2022-02-11: qty 0.5

## 2022-02-11 MED ORDER — MAGIC MOUTHWASH
15.0000 mL | Freq: Four times a day (QID) | ORAL | Status: DC | PRN
  Filled 2022-02-11: qty 20

## 2022-02-11 MED ORDER — ONDANSETRON 4 MG PO TBDP
4.0000 mg | ORAL_TABLET | Freq: Four times a day (QID) | ORAL | Status: DC | PRN
Start: 2022-02-11 — End: 2022-02-13
  Filled 2022-02-11: qty 1

## 2022-02-11 MED ORDER — HYDROMORPHONE HCL 1 MG/ML IJ SOLN: 0.5000 mg | INTRAMUSCULAR | Status: DC | PRN

## 2022-02-11 MED ORDER — OXYBUTYNIN CHLORIDE 5 MG PO TABS
2.5000 mg | ORAL_TABLET | Freq: Four times a day (QID) | ORAL | Status: DC | PRN
  Filled 2022-02-11: qty 0.5

## 2022-02-11 MED ORDER — ALUM & MAG HYDROXIDE-SIMETH 200-200-20 MG/5ML PO SUSP: 30.0000 mL | Freq: Four times a day (QID) | ORAL | Status: DC | PRN

## 2022-02-11 MED ORDER — MORPHINE SULFATE (CONCENTRATE) 10 MG/0.5ML PO SOLN
5.0000 mg | ORAL | Status: DC | PRN
  Administered 2022-02-12 (×2): 5 mg via SUBLINGUAL
  Filled 2022-02-11: qty 0.5

## 2022-02-11 MED ORDER — LORAZEPAM 2 MG/ML PO CONC
1.0000 mg | ORAL | Status: DC | PRN
  Filled 2022-02-11: qty 0.5

## 2022-02-11 NOTE — Progress Notes (Signed)
[facility]) Hospital Liaison Note ?  ?Unfortunately, Hospice Home is not able to offer a room today. [facility] Manager aware hospital liaison will follow up tomorrow or sooner if a room becomes available.  ?  ?Please do not hesitate to call with any hospice related questions.  ?  ?Thank you for the opportunity to participate in this patient's care. ?  ?Daphene Calamity, MSW ?[place] ?[phone] ?Daphene Calamity ?

## 2022-02-11 NOTE — Progress Notes (Signed)
PT Cancellation Note ? ?Patient Details ?Name: [name] ?MRN: [id number] ?DOB: 1927-05-19 ? ? ?Cancelled Treatment:    Reason Eval/Treat Not Completed: Medical issues which prohibited therapy. Patient/family has agreed for patient to go to hospice home when bed available. HR in 140s. Patient is not appropriate for skilled PT. Completing order and signing off at this time.   ? ? ?Keyona Emrich ?02/11/2022, 8:36 AM ?

## 2022-02-11 NOTE — Progress Notes (Addendum)
?PROGRESS NOTE ? ? ? ?[name]  SHF:[id number] DOB: June 09, 1927 DOA: 02/01/2022 ?PCP: Dion Body, MD  ? ? ?Brief Narrative:  ? 88 year old man with history of dementia, hyperlipidemia, hypertension and cardiomyopathy and CAD.  Patient sent in by PMD with lower extremity edema and fast heart rate.  Initially found to have SVT but then converted over to atrial flutter rapid heart rate.  Patient was diuresed for congestive heart failure.  EF low at 20 to 25%.  Blood pressure on the lower side with having to go up on Coreg for heart rate.  We needed to add amiodarone to control heart rate better.  Held IV Lasix, spironolactone and ACE inhibitor secondary to acute kidney injury and relative hypotension.  The patient has had acute delirium for the last 3 days.    ? ?Patient is remained lethargic and encephalopathic.  On 4/7 had a meeting with the patient's son [name] at bedside.  Reviewed images, clinical progression, natural progression of underlying chronic disease.  All questions were answered.  Decision made to proceed with hospice referral.  Hospice liaison engaged and will reach out to patient's son ? ?Patient was accepted to residential hospice facility.  As of 4/10 there is not an available bed at the hospice home.  Hospice liaison will reach out to family once bed status changes. ? ?I had a conversation with patient's son [name] at bedside on 4/10.  As we are waiting on a hospice bed and patient's clinical status seems to be declining I recommend transition to full comfort measures at this time.  Son is in agreement, all comfort measures orders have been placed. ? ? ?Assessment & Plan: ?  ?Principal Problem: ?  Acute on chronic systolic CHF (congestive heart failure) ([place]) ?Active Problems: ?  CHF (congestive heart failure) ([place]) ?  Dementia ([place]) ?  Essential hypertension ?  Atrial flutter with rapid ventricular response ([place]) ?  Hypokalemia ?  Delirium ?  AKI (acute kidney injury) ([place]) ?   Weakness ?  Malnutrition of moderate degree ? ? ?Weakness ?Functional decline ?Adult failure to thrive ?After discussion with patient's son decision made to transition to full comfort measures while the patient remains admitted awaiting inpatient hospice bed.  All medications not focused on patient comfort have been discontinued.  Initiate comfort measures orders only with morphine sublingual, as needed IV Dilaudid, as needed IV Ativan, Robinul.  DNR status.  Awaiting inpatient hospice bed. ? ? ? ?DVT prophylaxis: Comfort ?Code Status: DNR ?Family Communication: Son via phone [phone] on 4/5, left VM on 4/6, son [name] at bedside 4/7, left VM on 4/9, at bedside 4/10 ?Disposition Plan: Status is: Inpatient ?Remains inpatient appropriate because: Unsafe discharge plan.  Pending hospice versus home bed availability. ? ? ?Level of care: Progressive ? ?Consultants:  ?Cardiology ? ?Procedures:  ?None ? ?Antimicrobials: ?None ? ? ?Subjective: ?Seen and examined.  Lethargic and sleepy.  Awakens but does not participate in interview. ? ?Objective: ?Vitals:  ? 02/11/22 0314 02/11/22 0324 02/11/22 0517 02/11/22 0807  ?BP: 118/79   102/84  ?Pulse: (!) 141  73 (!) 145  ?Resp: '18  16 16  '$ ?Temp: 98.6 ?F (37 ?C)   (!) 97.4 ?F (36.3 ?C)  ?TempSrc: Axillary   Axillary  ?SpO2: 96%  95% 97%  ?Weight:  62 kg    ?Height:      ? ? ?Intake/Output Summary (Last 24 hours) at 02/11/2022 1331 ?Last data filed at 02/11/2022 0003 ?Gross per 24 hour  ?  Intake 0 ml  ?Output 475 ml  ?Net -475 ml  ? ?Filed Weights  ? 02/09/22 0332 02/10/22 0312 02/11/22 0324  ?Weight: 64 kg 62.7 kg 62 kg  ? ? ?Examination: ? ?General exam: Frail appearing.  In no visible distress ?Respiratory system: Poor respiratory effort.  Bibasilar crackles.  Normal work of breathing.  2 L ?Cardiovascular system: Tachycardic, regular rhythm, no murmurs ?Gastrointestinal system: Soft, NT/ND, normal bowel sounds ?Central nervous system: Lethargic.  Unable to assess  orientation ?Extremities: Unable to assess power ?Skin: No rashes, lesions or ulcers ?Psychiatry: Unable to assess ? ? ? ?Data Reviewed: I have personally reviewed following labs and imaging studies ? ?CBC: ?Recent Labs  ?Lab 02/07/22 ?0742 02/08/22 ?0946  ?WBC 14.4* 12.9*  ?NEUTROABS 11.7* 10.5*  ?HGB 14.0 14.6  ?HCT 42.6 44.8  ?MCV 97.3 100.0  ?PLT 138* 173  ? ?Basic Metabolic Panel: ?Recent Labs  ?Lab 02/05/22 ?0414 02/07/22 ?4650 02/08/22 ?0946  ?NA 141 143 147*  ?K 3.5 3.1* 3.5  ?CL 101 103 108  ?CO2 '27 28 25  '$ ?GLUCOSE 117* 117* 98  ?BUN 42* 62* 62*  ?CREATININE 1.03 1.10 1.01  ?CALCIUM 8.4* 8.7* 9.0  ?MG  --  2.0  --   ? ?GFR: ?Estimated Creatinine Clearance: 40.1 mL/min (by C-G formula based on SCr of 1.01 mg/dL). ?Liver Function Tests: ?No results for input(s): AST, ALT, ALKPHOS, BILITOT, PROT, ALBUMIN in the last 168 hours. ? ?No results for input(s): LIPASE, AMYLASE in the last 168 hours. ?No results for input(s): AMMONIA in the last 168 hours. ?Coagulation Profile: ?No results for input(s): INR, PROTIME in the last 168 hours. ?Cardiac Enzymes: ?No results for input(s): CKTOTAL, CKMB, CKMBINDEX, TROPONINI in the last 168 hours. ?BNP (last 3 results) ?No results for input(s): PROBNP in the last 8760 hours. ?HbA1C: ?No results for input(s): HGBA1C in the last 72 hours. ?CBG: ?No results for input(s): GLUCAP in the last 168 hours. ?Lipid Profile: ?No results for input(s): CHOL, HDL, LDLCALC, TRIG, CHOLHDL, LDLDIRECT in the last 72 hours. ?Thyroid Function Tests: ?No results for input(s): TSH, T4TOTAL, FREET4, T3FREE, THYROIDAB in the last 72 hours. ?Anemia Panel: ?No results for input(s): VITAMINB12, FOLATE, FERRITIN, TIBC, IRON, RETICCTPCT in the last 72 hours. ?Sepsis Labs: ?No results for input(s): PROCALCITON, LATICACIDVEN in the last 168 hours. ? ? ?Recent Results (from the past 240 hour(s))  ?Urine Culture     Status: Abnormal  ? Collection Time: 02/07/22  3:33 PM  ? Specimen: Urine, Clean Catch   ?Result Value Ref Range Status  ? Specimen Description   Final  ?  URINE, CLEAN CATCH ?Performed at [facility], [address] [place], [place] [address] ?  ? Special Requests   Final  ?  NONE ?Performed at [facility], [address] [place], [place] [address] ?  ? Culture 10,000 COLONIES/mL ESCHERICHIA COLI (A)  Final  ? Report Status 02/10/2022 FINAL  Final  ? Organism ID, Bacteria ESCHERICHIA COLI (A)  Final  ?    Susceptibility  ? Escherichia coli - MIC*  ?  AMPICILLIN >=32 RESISTANT Resistant   ?  CEFAZOLIN <=4 SENSITIVE Sensitive   ?  CEFEPIME <=0.12 SENSITIVE Sensitive   ?  CEFTRIAXONE <=0.25 SENSITIVE Sensitive   ?  CIPROFLOXACIN <=0.25 SENSITIVE Sensitive   ?  GENTAMICIN <=1 SENSITIVE Sensitive   ?  IMIPENEM <=0.25 SENSITIVE Sensitive   ?  NITROFURANTOIN <=16 SENSITIVE Sensitive   ?  TRIMETH/SULFA <=20 SENSITIVE Sensitive   ?  AMPICILLIN/SULBACTAM >=32 RESISTANT Resistant   ?  PIP/TAZO <=4 SENSITIVE Sensitive   ?  * 10,000 COLONIES/mL ESCHERICHIA COLI  ?  ? ? ? ? ? ?Radiology Studies: ?No results found. ? ? ? ? ? ?Scheduled Meds: ? ? ?Continuous Infusions: ? sodium chloride 10 mL/hr at 02/10/22 0037  ? ? ? LOS: 10 days  ? ? ? ? ?Sidney Ace, MD ?[facility] ? ? ?If 7PM-7AM, please contact night-coverage ? ?02/11/2022, 1:31 PM  ? ?

## 2022-02-11 NOTE — Care Management Important Message (Signed)
Important Message ? ?Patient Details  ?Name: [name] ?MRN: [id number] ?Date of Birth: 08-Jul-1927 ? ? ?Medicare Important Message Given:  Other (see comment) ? ?Disposition to discharge to hospice home pending bed availability.  Medicare IM withheld at this time out of respect for patient and family. ? ? ?Dannette Barbara ?02/11/2022, 8:49 AM ?

## 2022-02-11 NOTE — Plan of Care (Signed)
Consult noted for goals of care conversation.  Chart reviewed.  Patient has been approved and is currently awaiting a bed at the hospice facility.  Will sign off at this time as goals are set.  Please reconsult for further needs. ?

## 2022-02-11 NOTE — Progress Notes (Signed)
This RN observed nursing student for cares this shift- all AM meds held due to patient not swallowing safely. Patient given a sip of water, he held water in mouth and did not swallow. Patient not following commands.  ?Patient appears comfortable and PAINAD scored 0.  ? ?

## 2022-02-12 ENCOUNTER — Ambulatory Visit: Payer: Medicare HMO | Admitting: Family

## 2022-02-12 DIAGNOSIS — I5023 Acute on chronic systolic (congestive) heart failure: Secondary | ICD-10-CM | POA: Diagnosis not present

## 2022-02-12 NOTE — Progress Notes (Signed)
Discharged patient via EMS to [place]. Son notified and facility notified. ?

## 2022-02-12 NOTE — Discharge Summary (Signed)
Physician Discharge Summary  ?[name] WLN:[id number] DOB: Oct 09, 1927 DOA: 02/01/2022 ? ?PCP: Dion Body, MD ? ?Admit date: 02/01/2022 ?Discharge date: 02/12/2022 ? ?Admitted From: Home ?Disposition: Residential hospice facility ? ?Recommendations for Outpatient Follow-up:  ?None ? ? ?Home Health: N/A ?Equipment/Devices: None ? ?Discharge Condition: Hospice ?CODE STATUS: DNR ?Diet recommendation: Comfort ? ?Brief/Interim Summary: ?88 year old man with history of dementia, hyperlipidemia, hypertension and cardiomyopathy and CAD.  Patient sent in by PMD with lower extremity edema and fast heart rate.  Initially found to have SVT but then converted over to atrial flutter rapid heart rate.  Patient was diuresed for congestive heart failure.  EF low at 20 to 25%.  Blood pressure on the lower side with having to go up on Coreg for heart rate.  We needed to add amiodarone to control heart rate better.  Held IV Lasix, spironolactone and ACE inhibitor secondary to acute kidney injury and relative hypotension.  The patient has had acute delirium for the last 3 days.    ?  ?Patient is remained lethargic and encephalopathic.  On 4/7 had a meeting with the patient's son [name] at bedside.  Reviewed images, clinical progression, natural progression of underlying chronic disease.  All questions were answered.  Decision made to proceed with hospice referral.  Hospice liaison engaged and will reach out to patient's son ?  ?Patient was accepted to residential hospice facility.  As of 4/10 there is not an available bed at the hospice home.  Hospice liaison will reach out to family once bed status changes. ?  ?I had a conversation with patient's son [name] at bedside on 4/10.  As we are waiting on a hospice bed and patient's clinical status seems to be declining I recommend transition to full comfort measures at this time.  Son is in agreement, all comfort measures orders have been placed. ? ?Per hospice liaison residential  hospice able to offer a bed today. ? ? ? ?Discharge Diagnoses:  ?Principal Problem: ?  Acute on chronic systolic CHF (congestive heart failure) ([place]) ?Active Problems: ?  CHF (congestive heart failure) ([place]) ?  Dementia ([place]) ?  Essential hypertension ?  Atrial flutter with rapid ventricular response ([place]) ?  Hypokalemia ?  Delirium ?  AKI (acute kidney injury) ([place]) ?  Weakness ?  Malnutrition of moderate degree ? ?Weakness ?Functional decline ?Adult failure to thrive ?After discussion with patient's son decision made to transition to full comfort measures while the patient remains admitted awaiting inpatient hospice bed.  All medications not focused on patient comfort have been discontinued.  Initiate comfort measures orders only with morphine sublingual, as needed IV Dilaudid, as needed IV Ativan, Robinul.  DNR status.   ? ?Inpatient hospice bed available 4/11  ? ?Discharge Instructions ? ?Discharge Instructions   ? ? Diet - low sodium heart healthy   Complete by: As directed ?  ? Increase activity slowly   Complete by: As directed ?  ? ?  ? ?Allergies as of 02/12/2022   ?No Known Allergies ?  ? ?  ?Medication List  ?  ? ?STOP taking these medications   ? ?acetaminophen 500 MG tablet ?Commonly known as: TYLENOL ?  ?aspirin 81 MG tablet ?  ?atorvastatin 20 MG tablet ?Commonly known as: LIPITOR ?  ?carvedilol 3.125 MG tablet ?Commonly known as: COREG ?  ?donepezil 10 MG tablet ?Commonly known as: ARICEPT ?  ?hydrochlorothiazide 12.5 MG tablet ?Commonly known as: HYDRODIURIL ?  ?isosorbide mononitrate 30 MG 24 hr  tablet ?Commonly known as: IMDUR ?  ?Lactobacillus Probiotic Tabs ?  ?lisinopril 2.5 MG tablet ?Commonly known as: ZESTRIL ?  ?nitroGLYCERIN 0.4 MG SL tablet ?Commonly known as: NITROSTAT ?  ?omeprazole 20 MG capsule ?Commonly known as: PRILOSEC ?  ?spironolactone 25 MG tablet ?Commonly known as: ALDACTONE ?  ? ?  ? ? ?No Known Allergies ? ?Consultations: ?Palliative care ? ? ?Procedures/Studies: ?DG Chest 2  View ? ?Result Date: 02/01/2022 ?CLINICAL DATA:  Chest pain EXAM: CHEST - 2 VIEW COMPARISON:  June 9 21. FINDINGS: Low lung volumes. Chronic prominent lung markings. Streaky right basilar opacities, favor atelectasis. No visible pneumothorax. Possible trace right pleural effusion. Enlarged cardiac silhouette. Pulmonary vascular congestion. IMPRESSION: 1. Cardiomegaly and pulmonary vascular congestion. Possible trace right pleural effusion. 2. Streaky right basilar opacities, favor atelectasis over pneumonia and/or aspiration. 3. Chronically prominent lung markings. Electronically Signed   By: Margaretha Sheffield M.D.   On: 02/01/2022 12:51  ? ?CT HEAD WO CONTRAST (5MM) ? ?Result Date: 02/07/2022 ?CLINICAL DATA:  Mental status change, unknown cause. EXAM: CT HEAD WITHOUT CONTRAST TECHNIQUE: Contiguous axial images were obtained from the base of the skull through the vertex without intravenous contrast. RADIATION DOSE REDUCTION: This exam was performed according to the departmental dose-optimization program which includes automated exposure control, adjustment of the mA and/or kV according to patient size and/or use of iterative reconstruction technique. COMPARISON:  May 04, 2021 FINDINGS: Brain: Age-related global parenchymal volume loss with ex vacuo dilatation of the ventricular system is unchanged from prior. Similar advanced burden of chronic white matter ischemic disease. No evidence of acute large vascular territory infarction, hemorrhage, hydrocephalus, extra-axial collection or mass lesion/mass effect. Vascular: Calcifications of the internal carotid arteries at the skull base. Skull: No acute abnormality. Sinuses/Orbits: Opacification right maxillary sinus. Prior lens surgery. Probable cerumen in the external auditory canal. Other: Probable cerumen in the left external auditory canal. Mastoid air cells are predominantly clear IMPRESSION: 1. No acute intracranial abnormality. 2. Similar advanced age-related global  parenchymal volume loss and chronic white matter ischemic disease. Electronically Signed   By: Dahlia Bailiff M.D.   On: 02/07/2022 14:44  ? ?ECHOCARDIOGRAM COMPLETE ? ?Result Date: 02/03/2022 ?   ECHOCARDIOGRAM REPORT   Patient Name:   [name] Date of Exam: 02/03/2022 Medical Rec #:  [id number]           Height:       71.0 in Accession #:    [id number]          Weight:       150.4 lb Date of Birth:  1927-05-04           BSA:          1.868 m? Patient Age:    88 years            BP:           113/69 mmHg Patient Gender: M                   HR:           125 bpm. Exam Location:  [facility] Procedure: 2D Echo, Cardiac Doppler and Color Doppler Indications:     I50.33 Acute on chronic diastolic (congestive) heart failure  History:         Patient has no prior history of Echocardiogram examinations.                  Arrythmias:SVT (supraventricular tachycardia) and Atrial  Flutter, Signs/Symptoms:Chest Pain; Risk Factors:Hypertension                  and Dyslipidemia.  Sonographer:     Luane School RDCS Referring Phys:  [id number] Loletha Grayer Diagnosing Phys: Isaias Cowman MD IMPRESSIONS  1. Left ventricular ejection fraction, by estimation, is 20 to 25%. The left ventricle has severely decreased function. The left ventricle demonstrates regional wall motion abnormalities (see scoring diagram/findings for description). The left ventricular internal cavity size was moderately dilated. Left ventricular diastolic parameters are indeterminate.  2. Right ventricular systolic function is normal. The right ventricular size is normal.  3. Left atrial size was moderately dilated.  4. The mitral valve is normal in structure. Moderate mitral valve regurgitation. No evidence of mitral stenosis.  5. Tricuspid valve regurgitation is mild to moderate.  6. The aortic valve is normal in structure. Aortic valve regurgitation is mild. No aortic stenosis is present.  7. The inferior vena cava is normal in size with  greater than 50% respiratory variability, suggesting right atrial pressure of 3 mmHg. FINDINGS  Left Ventricle: Left ventricular ejection fraction, by estimation, is 20 to 25%. The left ventricle has sev

## 2022-02-12 NOTE — TOC Progression Note (Signed)
Transition of Care (TOC) - Progression Note  ? ? ?Patient Details  ?Name: [name] ?MRN: [id number] ?Date of Birth: 04-05-1927 ? ?Transition of Care (TOC) CM/SW Contact  ?Rachell Druckenmiller A Hurschel Paynter, LCSW ?Phone Number: ?02/12/2022, 11:48 AM ? ?Clinical Narrative:   No hospice bed today per [facility] with [employer].  ? ? ? ?Expected Discharge Plan: [place] ?Barriers to Discharge: Continued Medical Work up ? ?Expected Discharge Plan and Services ?Expected Discharge Plan: [place] ?  ?  ?  ?Living arrangements for the past 2 months: [place] ?                ?  ?  ?  ?  ?  ?HH Arranged: PT, RN ?[place] Agency: [place] ?Date HH Agency Contacted: 02/04/22 ?Time [place]: 1010 ?Representative spoke with at [place]: Tommi Rumps ? ? ?Social Determinants of Health (SDOH) Interventions ?  ? ?Readmission Risk Interventions ?   ? View : No data to display.  ?  ?  ?  ? ? ?

## 2022-02-12 NOTE — TOC Transition Note (Signed)
Transition of Care (TOC) - CM/SW Discharge Note ? ? ?Patient Details  ?Name: [name] ?MRN: [id number] ?Date of Birth: 1927-05-04 ? ?Transition of Care (TOC) CM/SW Contact:  ?Carmino Ocain A Mateus Rewerts, LCSW ?Phone Number: ?02/12/2022, 2:08 PM ? ? ?Clinical Narrative:  Pt to dc to Hospice Home in [place] today. Transport scheduled for 6:00 per request of [place] with [employer].   ? ? ? ?Final next level of care: [place] ?Barriers to Discharge: No Barriers Identified ? ? ?Patient Goals and CMS Choice ?Patient states their goals for this hospitalization and ongoing recovery are:: to go home ?CMS Medicare.gov Compare Post Acute Care list provided to:: Patient Represenative (must comment) (son) ?Choice offered to / list presented to : Adult Children ? ?Discharge Placement ?  ?           ?  ?Patient to be transferred to facility by: [facility] ?  ?Patient and family notified of of transfer: 02/12/22 ? ?Discharge Plan and Services ?  ?  ?           ?  ?  ?  ?  ?  ?HH Arranged: PT, RN ?[place] Agency: [place] ?Date HH Agency Contacted: 02/04/22 ?Time [place]: 1010 ?Representative spoke with at [place]: Tommi Rumps ? ?Social Determinants of Health (SDOH) Interventions ?  ? ? ?Readmission Risk Interventions ?   ? View : No data to display.  ?  ?  ?  ? ? ? ? ? ?

## 2022-02-12 NOTE — Progress Notes (Addendum)
[facility]) Hospital Liaison Note ?  ?Unfortunately, Hospice Home is not able to offer a room today. [facility] Manager aware hospital liaison will follow up tomorrow or sooner if a room becomes available.  ?  ?Please do not hesitate to call with any hospice related questions.  ?  ?Thank you for the opportunity to participate in this patient's care. ?  ?Addendum:1:25p ? ?[place] staff is contacting son/Ervin for Consent forms to be completed. ? ?EMS notified of patient D/C and transport arranged by TOC/[place] for 6p per [place] staff request. Attending Physician/Dr. Priscella Mann also notified of transport arrangement.  ?  ?Please send signed DNR form with patient and RN call report to [phone].  ?  ?Daphene Calamity, MSW ?[place] ?[phone] ? ?

## 2022-03-04 DEATH — deceased
